# Patient Record
Sex: Female | Born: 1960 | Race: White | Hispanic: No | Marital: Married | Smoking: Never smoker
Health system: Southern US, Community
[De-identification: ages and names within clinical notes are randomized; demographics above are authoritative.]

## PROBLEM LIST (undated history)

## (undated) DIAGNOSIS — D649 Anemia, unspecified: Secondary | ICD-10-CM

## (undated) DIAGNOSIS — E782 Mixed hyperlipidemia: Secondary | ICD-10-CM

## (undated) DIAGNOSIS — M1990 Unspecified osteoarthritis, unspecified site: Secondary | ICD-10-CM

## (undated) DIAGNOSIS — L439 Lichen planus, unspecified: Secondary | ICD-10-CM

## (undated) DIAGNOSIS — R42 Dizziness and giddiness: Secondary | ICD-10-CM

## (undated) DIAGNOSIS — E785 Hyperlipidemia, unspecified: Secondary | ICD-10-CM

## (undated) DIAGNOSIS — F419 Anxiety disorder, unspecified: Secondary | ICD-10-CM

## (undated) DIAGNOSIS — M545 Low back pain, unspecified: Secondary | ICD-10-CM

## (undated) DIAGNOSIS — E669 Obesity, unspecified: Secondary | ICD-10-CM

## (undated) DIAGNOSIS — K219 Gastro-esophageal reflux disease without esophagitis: Secondary | ICD-10-CM

## (undated) DIAGNOSIS — I519 Heart disease, unspecified: Secondary | ICD-10-CM

## (undated) DIAGNOSIS — I10 Essential (primary) hypertension: Secondary | ICD-10-CM

## (undated) DIAGNOSIS — R002 Palpitations: Secondary | ICD-10-CM

## (undated) DIAGNOSIS — R079 Chest pain, unspecified: Secondary | ICD-10-CM

## (undated) HISTORY — PX: WISDOM TOOTH EXTRACTION: SHX21

## (undated) HISTORY — DX: Essential (primary) hypertension: I10

## (undated) HISTORY — PX: ABDOMINAL HYSTERECTOMY: SHX81

## (undated) HISTORY — DX: Low back pain, unspecified: M54.50

## (undated) HISTORY — DX: Anemia, unspecified: D64.9

## (undated) HISTORY — DX: Anxiety disorder, unspecified: F41.9

## (undated) HISTORY — DX: Mixed hyperlipidemia: E78.2

---
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
## 2005-12-20 ENCOUNTER — Ambulatory Visit (HOSPITAL_COMMUNITY): Admission: RE | Admit: 2005-12-20 | Discharge: 2005-12-20 | Payer: Self-pay | Admitting: Pulmonary Disease

## 2008-01-22 ENCOUNTER — Encounter: Admission: RE | Admit: 2008-01-22 | Discharge: 2008-01-22 | Payer: Self-pay | Admitting: Pulmonary Disease

## 2008-04-13 ENCOUNTER — Emergency Department (HOSPITAL_COMMUNITY): Admission: EM | Admit: 2008-04-13 | Discharge: 2008-04-13 | Payer: Self-pay | Admitting: Emergency Medicine

## 2008-04-22 ENCOUNTER — Ambulatory Visit (HOSPITAL_COMMUNITY): Admission: RE | Admit: 2008-04-22 | Discharge: 2008-04-22 | Payer: Self-pay | Admitting: Pulmonary Disease

## 2009-02-17 ENCOUNTER — Encounter: Admission: RE | Admit: 2009-02-17 | Discharge: 2009-02-17 | Payer: Self-pay | Admitting: Pulmonary Disease

## 2010-02-23 ENCOUNTER — Encounter
Admission: RE | Admit: 2010-02-23 | Discharge: 2010-02-23 | Payer: Self-pay | Source: Home / Self Care | Attending: Pulmonary Disease | Admitting: Pulmonary Disease

## 2010-07-04 ENCOUNTER — Emergency Department (HOSPITAL_COMMUNITY)
Admission: EM | Admit: 2010-07-04 | Discharge: 2010-07-04 | Disposition: A | Discharge status: Home / Self Care | Payer: Managed Care, Other (non HMO) | Attending: Emergency Medicine | Admitting: Emergency Medicine

## 2010-07-04 DIAGNOSIS — S0010XA Contusion of unspecified eyelid and periocular area, initial encounter: Secondary | ICD-10-CM | POA: Insufficient documentation

## 2010-07-04 DIAGNOSIS — X58XXXA Exposure to other specified factors, initial encounter: Secondary | ICD-10-CM | POA: Insufficient documentation

## 2011-02-16 ENCOUNTER — Other Ambulatory Visit: Payer: Self-pay | Admitting: Pulmonary Disease

## 2011-02-16 DIAGNOSIS — Z1231 Encounter for screening mammogram for malignant neoplasm of breast: Secondary | ICD-10-CM

## 2011-03-08 ENCOUNTER — Ambulatory Visit
Admission: RE | Admit: 2011-03-08 | Discharge: 2011-03-08 | Disposition: A | Discharge status: Home / Self Care | Payer: Managed Care, Other (non HMO) | Source: Ambulatory Visit | Attending: Pulmonary Disease | Admitting: Pulmonary Disease

## 2011-03-08 DIAGNOSIS — Z1231 Encounter for screening mammogram for malignant neoplasm of breast: Secondary | ICD-10-CM

## 2011-11-18 ENCOUNTER — Telehealth: Payer: Self-pay

## 2011-11-18 NOTE — Telephone Encounter (Signed)
LMOM to call.

## 2011-12-06 NOTE — Telephone Encounter (Signed)
LMOM to call.

## 2011-12-07 NOTE — Telephone Encounter (Signed)
Letter to pt and PCP.  

## 2012-06-23 ENCOUNTER — Telehealth: Payer: Self-pay

## 2012-06-23 NOTE — Telephone Encounter (Signed)
LMOM to call.

## 2012-06-27 NOTE — Telephone Encounter (Signed)
LMOM to call.

## 2012-06-29 ENCOUNTER — Other Ambulatory Visit: Payer: Self-pay

## 2012-06-29 ENCOUNTER — Telehealth: Payer: Self-pay

## 2012-06-29 DIAGNOSIS — Z1211 Encounter for screening for malignant neoplasm of colon: Secondary | ICD-10-CM

## 2012-06-29 NOTE — Telephone Encounter (Signed)
Gastroenterology Pre-Procedure Form    Request Date: 06/29/2012    Requesting Physician: Dr. Juanetta Gosling     PATIENT INFORMATION:  [name] is a 52 y.o., female (DOB=Sep 27, 1960).  PROCEDURE: Procedure(s) requested: colonoscopy Procedure Reason: screening for colon cancer  PATIENT REVIEW QUESTIONS: The patient reports the following:   1. Diabetes Melitis: no 2. Joint replacements in the past 12 months: no 3. Major health problems in the past 3 months: no 4. Has an artificial valve or MVP:no 5. Has been advised in past to take antibiotics in advance of a procedure like teeth cleaning: no}    MEDICATIONS & ALLERGIES:    Patient reports the following regarding taking any blood thinners:   Plavix? no Aspirin?no Coumadin?  no  Patient confirms/reports the following medications:  Current Outpatient Prescriptions  Medication Sig Dispense Refill  . Biotin 1000 MCG tablet Take 1,000 mcg by mouth 3 (three) times daily.      . ferrous sulfate 325 (65 FE) MG tablet Take 325 mg by mouth daily with breakfast.      . fish oil-omega-3 fatty acids 1000 MG capsule Take 1,200 mg by mouth daily.      . Multiple Vitamin (MULTIVITAMIN) capsule Take 1 capsule by mouth daily.      . NON FORMULARY Vitamin D3  2000 IU      One tablet daily      . NON FORMULARY Super B Complex    One tablet daily      . vitamin E 400 UNIT capsule Take 400 Units by mouth daily.       No current facility-administered medications for this visit.    Patient confirms/reports the following allergies:  No Known Allergies  Patient is appropriate to schedule for requested procedure(s): yes  AUTHORIZATION INFORMATION Primary Insurance:   ID #:   Group #:  Pre-Cert / Auth required:  Pre-Cert / Auth #:   Secondary Insurance:   ID #:   Group #:  Pre-Cert / Auth required:  Pre-Cert / Auth #:   No orders of the defined types were placed in this encounter.    SCHEDULE INFORMATION: Procedure has been scheduled as follows:   Date: 07/13/2012    Time: 2:30 PM  Location: [facility] Short Stay  This Gastroenterology Pre-Precedure Form is being routed to the following provider(s) for review: R. Roetta Sessions, MD

## 2012-06-29 NOTE — Telephone Encounter (Signed)
OK to schedule.  Hold iron for seven days.

## 2012-06-30 MED ORDER — PEG-KCL-NACL-NASULF-NA ASC-C 100 G PO SOLR: 1.0000 | ORAL | Status: DC

## 2012-06-30 NOTE — Telephone Encounter (Signed)
See separate triage.  

## 2012-06-30 NOTE — Telephone Encounter (Signed)
Rx sent to the pharmacy and instructions mailed to pt.  

## 2012-07-04 ENCOUNTER — Encounter (HOSPITAL_COMMUNITY): Payer: Self-pay | Admitting: Pharmacy Technician

## 2012-07-13 ENCOUNTER — Encounter (HOSPITAL_COMMUNITY)
Admission: RE | Disposition: A | Discharge status: Home / Self Care | Payer: Self-pay | Source: Ambulatory Visit | Attending: Internal Medicine

## 2012-07-13 ENCOUNTER — Ambulatory Visit (HOSPITAL_COMMUNITY)
Admission: RE | Admit: 2012-07-13 | Discharge: 2012-07-13 | Disposition: A | Discharge status: Home / Self Care | Payer: Managed Care, Other (non HMO) | Source: Ambulatory Visit | Attending: Internal Medicine | Admitting: Internal Medicine

## 2012-07-13 ENCOUNTER — Encounter (HOSPITAL_COMMUNITY): Payer: Self-pay | Admitting: *Deleted

## 2012-07-13 DIAGNOSIS — Z1211 Encounter for screening for malignant neoplasm of colon: Secondary | ICD-10-CM

## 2012-07-13 HISTORY — PX: COLONOSCOPY: SHX5424

## 2012-07-13 LAB — HM COLONOSCOPY

## 2012-07-13 SURGERY — COLONOSCOPY
Anesthesia: Moderate Sedation

## 2012-07-13 MED ORDER — ONDANSETRON HCL 4 MG/2ML IJ SOLN
INTRAMUSCULAR | Status: DC | PRN
  Administered 2012-07-13: 4 mg via INTRAVENOUS

## 2012-07-13 MED ORDER — MEPERIDINE HCL 100 MG/ML IJ SOLN
INTRAMUSCULAR | Status: DC | PRN
  Administered 2012-07-13: 50 mg via INTRAVENOUS
  Administered 2012-07-13: 25 mg via INTRAVENOUS

## 2012-07-13 MED ORDER — MEPERIDINE HCL 100 MG/ML IJ SOLN
INTRAMUSCULAR | Status: AC
  Filled 2012-07-13: qty 2

## 2012-07-13 MED ORDER — ONDANSETRON HCL 4 MG/2ML IJ SOLN
INTRAMUSCULAR | Status: AC
  Filled 2012-07-13: qty 2

## 2012-07-13 MED ORDER — MIDAZOLAM HCL 5 MG/5ML IJ SOLN
INTRAMUSCULAR | Status: DC | PRN
  Administered 2012-07-13: 1 mg via INTRAVENOUS
  Administered 2012-07-13 (×2): 2 mg via INTRAVENOUS

## 2012-07-13 MED ORDER — STERILE WATER FOR IRRIGATION IR SOLN
Status: DC | PRN
  Administered 2012-07-13: 14:00:00

## 2012-07-13 MED ORDER — SODIUM CHLORIDE 0.9 % IV SOLN
INTRAVENOUS | Status: DC
  Administered 2012-07-13: 13:00:00 via INTRAVENOUS

## 2012-07-13 MED ORDER — MIDAZOLAM HCL 5 MG/5ML IJ SOLN
INTRAMUSCULAR | Status: AC
  Filled 2012-07-13: qty 10

## 2012-07-13 NOTE — Op Note (Signed)
[facility] [address] [place], [address]   COLONOSCOPY PROCEDURE REPORT  PATIENT: [name], [name]  MR#:         [id number] BIRTHDATE: 02-03-1960 , 52  yrs. old GENDER: Female ENDOSCOPIST: R.  Roetta Sessions, MD FACP [facility] REFERRED BY:  Kari Baars, M.D. PROCEDURE DATE:  07/13/2012 PROCEDURE:     Screening Ileocolonoscopy  INDICATIONS: first ever average risk colorectal cancer screening examination  INFORMED CONSENT:  The risks, benefits, alternatives and imponderables including but not limited to bleeding, perforation as well as the possibility of a missed lesion have been reviewed.  The potential for biopsy, lesion removal, etc. have also been discussed.  Questions have been answered.  All parties agreeable. Please see the history and physical in the medical record for more information.  MEDICATIONS: Versed 5 mg IV and Demerol 75 mg IV in divided doses. Cetacaine spray. Zofran 4 mg IV  DESCRIPTION OF PROCEDURE:  After a digital rectal exam was performed, the EC-[id number] ([id number])  colonoscope was advanced from the anus through the rectum and colon to the area of the cecum, ileocecal valve and appendiceal orifice.  The cecum was deeply intubated.  These structures were well-seen and photographed for the record.  From the level of the cecum and ileocecal valve, the scope was slowly and cautiously withdrawn.  The mucosal surfaces were carefully surveyed utilizing scope tip deflection to facilitate fold flattening as needed.  The scope was pulled down into the rectum where a thorough examination including retroflexion was performed.    FINDINGS:  Adequate preparation. Normal-appearing rectal, colonic and distal 10 cm of terminal ileal mucosa.  THERAPEUTIC / DIAGNOSTIC MANEUVERS PERFORMED:  none  COMPLICATIONS: none  CECAL WITHDRAWAL TIME:  9 minutes  IMPRESSION:  Normal colonoscopy  RECOMMENDATIONS: Repeat screening colonoscopy in 10  years.   _______________________________ eSigned:  R. Roetta Sessions, MD FACP [facility] 07/13/2012 2:40 PM   CC:

## 2012-07-13 NOTE — H&P (Signed)
Primary Care Physician:  Fredirick Maudlin, MD Primary Gastroenterologist:  Dr. Jena Gauss  Pre-Procedure History & Physical: HPI:  [name] is a 52 y.o. female is here for a screening colonoscopy.   No bowel symptoms. No family history of colon cancer or colon polyps. No prior colonoscopy.   Past Medical History  Diagnosis Date  . Medical history non-contributory     Past Surgical History  Procedure Laterality Date  . Abdominal hysterectomy  1991-1992    [facility]    Prior to Admission medications   Medication Sig Start Date End Date Taking? Authorizing Provider  Biotin 1000 MCG tablet Take 1,000 mcg by mouth daily.    Yes Historical Provider, MD  ferrous sulfate 325 (65 FE) MG tablet Take 325 mg by mouth daily with breakfast.   Yes Historical Provider, MD  fish oil-omega-3 fatty acids 1000 MG capsule Take 1,200 mg by mouth daily.   Yes Historical Provider, MD  Multiple Vitamin (MULTIVITAMIN) capsule Take 1 capsule by mouth daily.   Yes Historical Provider, MD  NON FORMULARY Vitamin D3  2000 IU      One tablet daily   Yes Historical Provider, MD  NON FORMULARY Super B Complex    One tablet daily   Yes Historical Provider, MD  vitamin E 400 UNIT capsule Take 400 Units by mouth daily.   Yes Historical Provider, MD    Allergies as of 06/29/2012  . (No Known Allergies)    Family History  Problem Relation Age of Onset  . Colon cancer Neg Hx   . Prostate cancer Father     History   Social History  . Marital Status: Married    Spouse Name: N/A    Number of Children: N/A  . Years of Education: N/A   Occupational History  . Not on file.   Social History Main Topics  . Smoking status: Never Smoker   . Smokeless tobacco: Not on file  . [place] Use: [place]     [place]   . Drug Use: No  . Sexually Active: Yes    Birth Control/ Protection: Surgical   Other Topics Concern  . Not on file   Social History Narrative  . No narrative on file     Review of Systems: See HPI, otherwise negative ROS  Physical Exam: BP 137/78  Pulse 74  Temp(Src) 97.4 F (36.3 C) (Oral)  Resp 21  Ht 5\' 4"  (1.626 m)  Wt 177 lb (80.287 kg)  BMI 30.37 kg/m2  SpO2 99% General:   Alert,  Well-developed, well-nourished, pleasant and cooperative in NAD Head:  Normocephalic and atraumatic. Eyes:  Sclera clear, no icterus.   Conjunctiva pink. Ears:  Normal auditory acuity. Nose:  No deformity, discharge,  or lesions. Mouth:  No deformity or lesions, dentition normal. Neck:  Supple; no masses or thyromegaly. Lungs:  Clear throughout to auscultation.   No wheezes, crackles, or rhonchi. No acute distress. Heart:  Regular rate and rhythm; no murmurs, clicks, rubs,  or gallops. Abdomen:  Soft, nontender and nondistended. No masses, hepatosplenomegaly or hernias noted. Normal bowel sounds, without guarding, and without rebound.   Msk:  Symmetrical without gross deformities. Normal posture. Pulses:  Normal pulses noted. Extremities:  Without clubbing or edema. Neurologic:  Alert and  oriented x4;  grossly normal neurologically. Skin:  Intact without significant lesions or rashes. Cervical Nodes:  No significant cervical adenopathy. Psych:  Alert and cooperative. Normal mood and affect.  Impression/Plan: [name] is now here to undergo a screening colonoscopy.  First ever average risk screening examination.  Risks, benefits, limitations, imponderables and alternatives regarding colonoscopy have been reviewed with the patient. Questions have been answered. All parties agreeable.

## 2012-07-17 ENCOUNTER — Encounter (HOSPITAL_COMMUNITY): Payer: Self-pay | Admitting: Internal Medicine

## 2012-10-19 ENCOUNTER — Other Ambulatory Visit: Payer: Self-pay | Admitting: Pulmonary Disease

## 2012-10-19 DIAGNOSIS — Z1231 Encounter for screening mammogram for malignant neoplasm of breast: Secondary | ICD-10-CM

## 2013-04-05 ENCOUNTER — Ambulatory Visit: Payer: Managed Care, Other (non HMO)

## 2013-05-10 ENCOUNTER — Ambulatory Visit: Payer: Managed Care, Other (non HMO)

## 2013-06-07 ENCOUNTER — Encounter (INDEPENDENT_AMBULATORY_CARE_PROVIDER_SITE_OTHER): Payer: Self-pay

## 2013-06-07 ENCOUNTER — Ambulatory Visit
Admission: RE | Admit: 2013-06-07 | Discharge: 2013-06-07 | Disposition: A | Discharge status: Home / Self Care | Payer: Managed Care, Other (non HMO) | Source: Ambulatory Visit | Attending: Pulmonary Disease | Admitting: Pulmonary Disease

## 2013-06-07 DIAGNOSIS — Z1231 Encounter for screening mammogram for malignant neoplasm of breast: Secondary | ICD-10-CM

## 2014-10-01 ENCOUNTER — Other Ambulatory Visit: Payer: Self-pay

## 2014-10-01 DIAGNOSIS — Z1231 Encounter for screening mammogram for malignant neoplasm of breast: Secondary | ICD-10-CM

## 2014-10-17 ENCOUNTER — Ambulatory Visit
Admission: RE | Admit: 2014-10-17 | Discharge: 2014-10-17 | Disposition: A | Discharge status: Home / Self Care | Payer: Managed Care, Other (non HMO) | Source: Ambulatory Visit

## 2014-10-17 DIAGNOSIS — Z1231 Encounter for screening mammogram for malignant neoplasm of breast: Secondary | ICD-10-CM

## 2016-04-28 ENCOUNTER — Encounter (HOSPITAL_COMMUNITY): Payer: Self-pay | Admitting: *Deleted

## 2016-04-28 ENCOUNTER — Emergency Department (HOSPITAL_COMMUNITY)
Admission: EM | Admit: 2016-04-28 | Discharge: 2016-04-28 | Disposition: A | Discharge status: Home / Self Care | Payer: Managed Care, Other (non HMO) | Attending: Emergency Medicine | Admitting: Emergency Medicine

## 2016-04-28 ENCOUNTER — Emergency Department (HOSPITAL_COMMUNITY): Payer: Managed Care, Other (non HMO)

## 2016-04-28 DIAGNOSIS — Z79899 Other long term (current) drug therapy: Secondary | ICD-10-CM | POA: Insufficient documentation

## 2016-04-28 DIAGNOSIS — R0789 Other chest pain: Secondary | ICD-10-CM | POA: Diagnosis present

## 2016-04-28 DIAGNOSIS — R079 Chest pain, unspecified: Secondary | ICD-10-CM

## 2016-04-28 LAB — CBC
HCT: 40.2 % (ref 36.0–46.0)
Hemoglobin: 13.2 g/dL (ref 12.0–15.0)
MCH: 30.3 pg (ref 26.0–34.0)
MCHC: 32.8 g/dL (ref 30.0–36.0)
MCV: 92.4 fL (ref 78.0–100.0)
Platelets: 318 10*3/uL (ref 150–400)
RBC: 4.35 MIL/uL (ref 3.87–5.11)
RDW: 12.8 % (ref 11.5–15.5)
WBC: 5.9 10*3/uL (ref 4.0–10.5)

## 2016-04-28 LAB — D-DIMER, QUANTITATIVE: D-Dimer, Quant: 0.48 ug/mL-FEU (ref 0.00–0.50)

## 2016-04-28 LAB — BASIC METABOLIC PANEL
Anion gap: 7 (ref 5–15)
BUN: 12 mg/dL (ref 6–20)
CO2: 28 mmol/L (ref 22–32)
Calcium: 9.3 mg/dL (ref 8.9–10.3)
Chloride: 104 mmol/L (ref 101–111)
Creatinine, Ser: 0.78 mg/dL (ref 0.44–1.00)
GFR calc Af Amer: >60 mL/min (ref >60)
GFR calc non Af Amer: >60 mL/min (ref >60)
Glucose, Bld: 114 mg/dL — ABNORMAL HIGH (ref 65–99)
Potassium: 3.5 mmol/L (ref 3.5–5.1)
Sodium: 139 mmol/L (ref 135–145)

## 2016-04-28 LAB — TROPONIN I
Troponin I: <0.03 ng/mL (ref <0.03)
Troponin I: <0.03 ng/mL (ref <0.03)

## 2016-04-28 MED ORDER — FAMOTIDINE 20 MG PO TABS: 20.0000 mg | ORAL_TABLET | Freq: Two times a day (BID) | ORAL | 0 refills | Status: DC

## 2016-04-28 MED ORDER — GI COCKTAIL ~~LOC~~
30.0000 mL | Freq: Once | ORAL | Status: AC
  Administered 2016-04-28: 30 mL via ORAL
  Filled 2016-04-28: qty 30

## 2016-04-28 NOTE — ED Triage Notes (Signed)
Pt comes in for central chest pain that started last night. Pt took one asa and it didn't yield any relief. Pt describes this pain as a pressure. Denies any cardiac history.

## 2016-04-28 NOTE — ED Notes (Signed)
Patient transported to X-ray 

## 2016-04-28 NOTE — ED Provider Notes (Signed)
[facility]-EMERGENCY DEPT Provider Note   CSN: [id number] Arrival date & time: 04/28/16  1232     History   Chief Complaint Chief Complaint  Patient presents with  . Chest Pain    HPI [name] is a 56 y.o. female.  HPI  Pt was seen at 1345. Per pt, c/o gradual onset and persistence of constant chest "pain" that began approximately 0130 over night last night. Describes the CP as located in her central chest and "pressure." States the CP woke her up from sleep. Pt took ASA without improvement. Denies any hx of similar symptoms. Denies SOB/cough, no palpitations, no abd pain, no N/V/D, no back pain, no fevers, no rash.   Past Medical History:  Diagnosis Date  . Medical history non-contributory     There are no active problems to display for this patient.   Past Surgical History:  Procedure Laterality Date  . ABDOMINAL HYSTERECTOMY  1991-1992   [facility]  . COLONOSCOPY N/A 07/13/2012   Procedure: COLONOSCOPY;  Surgeon: Corbin Ade, MD;  Location: [facility] ENDO SUITE;  Service: Endoscopy;  Laterality: N/A;  2:30 PM-moved to [address] to notify pt    OB History    No data available       Home Medications    Prior to Admission medications   Medication Sig Start Date End Date Taking? Authorizing Provider  Biotin 1000 MCG tablet Take 1,000 mcg by mouth daily.     Historical Provider, MD  ferrous sulfate 325 (65 FE) MG tablet Take 325 mg by mouth daily with breakfast.    Historical Provider, MD  fish oil-omega-3 fatty acids 1000 MG capsule Take 1,200 mg by mouth daily.    Historical Provider, MD  Multiple Vitamin (MULTIVITAMIN) capsule Take 1 capsule by mouth daily.    Historical Provider, MD  NON FORMULARY Vitamin D3  2000 IU      One tablet daily    Historical Provider, MD  NON FORMULARY Super B Complex    One tablet daily    Historical Provider, MD  vitamin E 400 UNIT capsule Take 400 Units by mouth daily.    Historical Provider, MD    Family History Family History  Problem  Relation Age of Onset  . Prostate cancer Father   . Colon cancer Neg Hx     Social History Social History  Substance Use Topics  . Smoking status: Never Smoker  . Smokeless tobacco: Never Used  . Alcohol use Yes     [place]      Allergies   Patient has no known allergies.   Review of Systems Review of Systems ROS: Statement: All systems negative except as marked or noted in the HPI; Constitutional: Negative for fever and chills. ; ; Eyes: Negative for eye pain, redness and discharge. ; ; ENMT: Negative for ear pain, hoarseness, nasal congestion, sinus pressure and sore throat. ; ; Cardiovascular: +CP. Negative for palpitations, diaphoresis, dyspnea and peripheral edema. ; ; Respiratory: Negative for cough, wheezing and stridor. ; ; Gastrointestinal: Negative for nausea, vomiting, diarrhea, abdominal pain, blood in stool, hematemesis, jaundice and rectal bleeding. . ; ; Genitourinary: Negative for dysuria, flank pain and hematuria. ; ; Musculoskeletal: Negative for back pain and neck pain. Negative for swelling and trauma.; ; Skin: Negative for pruritus, rash, abrasions, blisters, bruising and skin lesion.; ; Neuro: Negative for headache, lightheadedness and neck stiffness. Negative for weakness, altered level of consciousness, altered mental status, extremity weakness,  paresthesias, involuntary movement, seizure and syncope.       Physical Exam Updated Vital Signs BP 135/84 (BP Location: Left Arm)   Pulse 83   Temp 97.9 F (36.6 C) (Oral)   Resp 18   Ht  (1.626 m)   Wt 205 lb (93 kg)   SpO2 99%   BMI 35.19 kg/m   Physical Exam 1350: Physical examination:  Nursing notes reviewed; Vital signs and O2 SAT reviewed;  Constitutional: Well developed, Well nourished, Well hydrated, In no acute distress; Head:  Normocephalic, atraumatic; Eyes: EOMI, PERRL, No scleral icterus; ENMT: Mouth and pharynx normal, Mucous membranes moist; Neck: Supple, Full  range of motion, No lymphadenopathy; Cardiovascular: Regular rate and rhythm, No gallop; Respiratory: Breath sounds clear & equal bilaterally, No wheezes.  Speaking full sentences with ease, Normal respiratory effort/excursion; Chest: Nontender, Movement normal; Abdomen: Soft, Nontender, Nondistended, Normal bowel sounds; Genitourinary: No CVA tenderness; Extremities: Pulses normal, No tenderness, No edema, No calf edema or asymmetry.; Neuro: AA&Ox3, Major CN grossly intact.  Speech clear. No gross focal motor or sensory deficits in extremities.; Skin: Color normal, Warm, Dry.   ED Treatments / Results  Labs (all labs ordered are listed, but only abnormal results are displayed)   EKG  EKG Interpretation  Date/Time:  Wednesday April 28 2016 12:41:09 EDT Ventricular Rate:  82 PR Interval:    QRS Duration: 95 QT Interval:  382 QTC Calculation: 447 R Axis:   63 Text Interpretation:  Sinus rhythm Atrial premature complex Borderline T abnormalities, diffuse leads Baseline wander Artifact No old tracing to compare Confirmed by [facility]  MD, Nicholos Johns [phone]) on 04/28/2016 2:05:14 PM        EKG Interpretation  Date/Time:  Wednesday April 28 2016 15:47:18 EDT Ventricular Rate:  60 PR Interval:    QRS Duration: 97 QT Interval:  421 QTC Calculation: 421 R Axis:   58 Text Interpretation:  Sinus rhythm Since last tracing of earlier today No significant change was found Confirmed by [facility]  MD, Nicholos Johns [phone]) on 04/28/2016 4:13:22 PM         Radiology   Procedures Procedures (including critical care time)  Medications Ordered in ED Medications  gi cocktail (Maalox,Lidocaine,Donnatal) (not administered)     Initial Impression / Assessment and Plan / ED Course  I have reviewed the triage vital signs and the nursing notes.  Pertinent labs & imaging results that were available during my care of the patient were reviewed by me and considered in my medical decision making (see chart  for details).  MDM Reviewed: previous chart, nursing note and vitals Reviewed previous: labs Interpretation: labs, ECG and x-ray   Results for orders placed or performed during the hospital encounter of 04/28/16  Basic metabolic panel  Result Value Ref Range   Sodium 139 135 - 145 mmol/L   Potassium 3.5 3.5 - 5.1 mmol/L   Chloride 104 101 - 111 mmol/L   CO2 28 22 - 32 mmol/L   Glucose, Bld 114 (H) 65 - 99 mg/dL   BUN 12 6 - 20 mg/dL   Creatinine, Ser 0.98 0.44 - 1.00 mg/dL   Calcium 9.3 8.9 - 11.9 mg/dL   GFR calc non Af Amer >60 >60 mL/min   GFR calc Af Amer >60 >60 mL/min   Anion gap 7 5 - 15  CBC  Result Value Ref Range   WBC 5.9 4.0 - 10.5 K/uL   RBC 4.35 3.87 - 5.11 MIL/uL   Hemoglobin 13.2 12.0 -  15.0 g/dL   HCT 09.8 11.9 - 14.7 %   MCV 92.4 78.0 - 100.0 fL   MCH 30.3 26.0 - 34.0 pg   MCHC 32.8 30.0 - 36.0 g/dL   RDW 82.9 56.2 - 13.0 %   Platelets 318 150 - 400 K/uL  Troponin I  Result Value Ref Range   Troponin I <0.03 <0.03 ng/mL  D-dimer, quantitative  Result Value Ref Range   D-Dimer, Quant 0.48 0.00 - 0.50 ug/mL-FEU  Troponin I  Result Value Ref Range   Troponin I <0.03 <0.03 ng/mL   Dg Chest 2 View Result Date: 04/28/2016 CLINICAL DATA:  Chest pain. EXAM: CHEST  2 VIEW COMPARISON:  Radiographs of March 29th 2010. FINDINGS: The heart size and mediastinal contours are within normal limits. Both lungs are clear. No pneumothorax or pleural effusion is noted. The visualized skeletal structures are unremarkable. IMPRESSION: No active cardiopulmonary disease. Electronically Signed   By: Lupita Raider, M.D.   On: 04/28/2016 13:07    1655:  Doubt PE as cause for symptoms with normal d-dimer and low risk [place].  Doubt ACS as cause for symptoms with normal troponin x2 and unchanged EKG from previous after 12+ hours of constant symptoms, HEART score 1 (age).  States GI cocktail did improve her symptoms somewhat. Tx symptomatically, f/u PMD, GI MD, Cards MD. Dx and  testing d/w pt and family.  Questions answered.  Verb understanding, agreeable to d/c home with outpt f/u.    Final Clinical Impressions(s) / ED Diagnoses   Final diagnoses:  None    New Prescriptions New Prescriptions   No medications on file     Samuel Jester, DO 05/01/16 2345

## 2016-04-28 NOTE — Discharge Instructions (Signed)
Eat a bland diet, avoiding greasy, fatty, fried foods, as well as spicy and acidic foods or beverages.  Avoid eating within the hour or 2 before going to bed or laying down.  Also avoid teas, colas, coffee, chocolate, pepermint and spearment.  May also take over the counter maalox/mylanta, as directed on packaging, as needed for discomfort.  Take the prescription as directed.  Call your regular medical doctor and the Cardiologist tomorrow to schedule a follow up appointment this week.  Call your GI doctor tomorrow to schedule a follow up appointment within the next week. Return to the Emergency Department immediately if worsening.

## 2016-06-01 ENCOUNTER — Encounter: Payer: Self-pay | Admitting: Cardiology

## 2016-06-01 ENCOUNTER — Ambulatory Visit (INDEPENDENT_AMBULATORY_CARE_PROVIDER_SITE_OTHER): Payer: Managed Care, Other (non HMO) | Admitting: Cardiology

## 2016-06-01 VITALS — BP 116/70 | HR 83 | Ht 64.0 in | Wt 205.0 lb

## 2016-06-01 DIAGNOSIS — R011 Cardiac murmur, unspecified: Secondary | ICD-10-CM | POA: Diagnosis not present

## 2016-06-01 DIAGNOSIS — R0789 Other chest pain: Secondary | ICD-10-CM

## 2016-06-01 DIAGNOSIS — I10 Essential (primary) hypertension: Secondary | ICD-10-CM

## 2016-06-01 NOTE — Progress Notes (Signed)
Clinical Summary [name] is a 56 y.o.female seen today as a new patient, referred by Dr Juanetta GoslingHawkins for chest pain.  1. Chest pain - chest pain started around 130AM while asleep. Crushing like pain midchest, 10/10 in severity. No other associated symptoms.  - not positional, not worst with palpation - went back to sleep, still had some pressure in midchest.  - constant pressure, better with GI cocktail. Returned after hours.   - seen in ER 04/2016 with chest pain. Trop neg x2. CXR no acute process. EKG SR with PACs, no ischemic changes - symptoms better with GI cocktail - started on pepcid. Since that time no recurrent peisodes.  - walks regularly at work, tolerating exertion without troubles  CAD: HTN, father stents x 5 in late 60s     2. HTN - started on Toprol recently    Past Medical History:  Diagnosis Date  . Medical history non-contributory      No Known Allergies   Current Outpatient Prescriptions  Medication Sig Dispense Refill  . Biotin 1000 MCG tablet Take 1,000 mcg by mouth daily.     . famotidine (PEPCID) 20 MG tablet Take 1 tablet (20 mg total) by mouth 2 (two) times daily. 30 tablet 0  . ferrous sulfate 325 (65 FE) MG tablet Take 325 mg by mouth daily with breakfast.    . fish oil-omega-3 fatty acids 1000 MG capsule Take 1,200 mg by mouth daily.    . Glucosamine-Chondroitin (GLUCOSAMINE CHONDR COMPLEX PO) Take 500 mg by mouth daily.    . Multiple Vitamin (MULTIVITAMIN WITH MINERALS) TABS tablet Take 1 tablet by mouth daily.    . Multiple Vitamin (MULTIVITAMIN) capsule Take 1 capsule by mouth daily.    . Turmeric 450 MG CAPS Take 1 capsule by mouth daily.     No current facility-administered medications for this visit.      Past Surgical History:  Procedure Laterality Date  . ABDOMINAL HYSTERECTOMY  1991-1992   [facility]  . COLONOSCOPY N/A 07/13/2012   Procedure: COLONOSCOPY;  Surgeon: Corbin Adeobert M Rourk, MD;  Location: [facility] ENDO SU[facility];  Service: Endoscopy;   Laterality: N/A;  2:30 PM-moved to [address] to notify pt     No Known Allergies    Family History  Problem Relation Age of Onset  . Prostate cancer Father   . Colon cancer Neg Hx      Social History Ms. Fontanella reports that she has never smoked. She has never used smokeless tobacco. [name] reports that she drinks alcohol.   Review of Systems CONSTITUTIONAL: No weight loss, fever, chills, weakness or fatigue.  HEENT: Eyes: No visual loss, blurred vision, double vision or yellow sclerae.No hearing loss, sneezing, congestion, runny nose or sore throat.  SKIN: No rash or itching.  CARDIOVASCULAR: per hpi RESPIRATORY: No shortness of breath, cough or sputum.  GASTROINTESTINAL: No anorexia, nausea, vomiting or diarrhea. No abdominal pain or blood.  GENITOURINARY: No burning on urination, no polyuria NEUROLOGICAL: No headache, dizziness, syncope, paralysis, ataxia, numbness or tingling in the extremities. No change in bowel or bladder control.  MUSCULOSKELETAL: No muscle, back pain, joint pain or stiffness.  LYMPHATICS: No enlarged nodes. No history of splenectomy.  PSYCHIATRIC: No history of depression or anxiety.  ENDOCRINOLOGIC: No reports of sweating, cold or heat intolerance. No polyuria or polydipsia.  Marland Kitchen.   Physical Examination Vitals:   06/01/16 0831 06/01/16 0839  BP: 118/72 116/70  Pulse: 83    Vitals:  06/01/16 0831  Weight: 205 lb (93 kg)  Height: 5\' 4"  (1.626 m)    Gen: resting comfortably, no acute distress HEENT: no scleral icterus, pupils equal round and reactive, no palptable cervical adenopathy,  CV: RRR, 2/6 sysotlic murmur at apex, no jvd Resp: Clear to auscultation bilaterally GI: abdomen is soft, non-tender, non-distended, normal bowel sounds, no hepatosplenomegaly MSK: extremities are warm, no edema.  Skin: warm, no rash Neuro:  no focal deficits Psych: appropriate affect      Assessment and Plan   1. Chest pain - symptoms  consistent with GI etiology, no reccurce since starting antacid - continue to monitor, would not pursue ischemic testing at this time  2. HTN - at goal, continue current meds  3. Heart murmur - obtain echo   F/u 6 months   Antoine Poche, M.D

## 2016-06-01 NOTE — Patient Instructions (Signed)

## 2016-06-03 ENCOUNTER — Ambulatory Visit (HOSPITAL_COMMUNITY)
Admission: RE | Admit: 2016-06-03 | Discharge: 2016-06-03 | Disposition: A | Discharge status: Home / Self Care | Payer: Managed Care, Other (non HMO) | Source: Ambulatory Visit | Attending: Cardiology | Admitting: Cardiology

## 2016-06-03 DIAGNOSIS — I071 Rheumatic tricuspid insufficiency: Secondary | ICD-10-CM | POA: Insufficient documentation

## 2016-06-03 DIAGNOSIS — Z8249 Family history of ischemic heart disease and other diseases of the circulatory system: Secondary | ICD-10-CM | POA: Insufficient documentation

## 2016-06-03 DIAGNOSIS — R011 Cardiac murmur, unspecified: Secondary | ICD-10-CM

## 2016-06-03 DIAGNOSIS — I209 Angina pectoris, unspecified: Secondary | ICD-10-CM | POA: Diagnosis not present

## 2016-06-03 NOTE — Progress Notes (Signed)
*  PRELIMINARY RESULTS* Echocardiogram 2D Echocardiogram has been performed.  Jeryl Columbialliott, Deng Kemler 06/03/2016, 12:18 PM

## 2016-06-10 ENCOUNTER — Encounter: Payer: Managed Care, Other (non HMO) | Admitting: Diagnostic Neuroimaging

## 2016-06-17 ENCOUNTER — Encounter (INDEPENDENT_AMBULATORY_CARE_PROVIDER_SITE_OTHER): Payer: Self-pay

## 2016-06-17 ENCOUNTER — Ambulatory Visit (INDEPENDENT_AMBULATORY_CARE_PROVIDER_SITE_OTHER): Payer: Managed Care, Other (non HMO) | Admitting: Diagnostic Neuroimaging

## 2016-06-17 DIAGNOSIS — R202 Paresthesia of skin: Secondary | ICD-10-CM

## 2016-06-17 DIAGNOSIS — Z0289 Encounter for other administrative examinations: Secondary | ICD-10-CM

## 2016-06-17 DIAGNOSIS — R200 Anesthesia of skin: Secondary | ICD-10-CM

## 2016-06-17 NOTE — Procedures (Signed)
[facility]  NCS (NERVE CONDUCTION STUDY) WITH EMG (ELECTROMYOGRAPHY) REPORT   STUDY DATE: 06/17/16 PATIENT NAME: [name] DOB: 02/20/1960 MRN: [id number]  ORDERING CLINICIAN: Kari BaarsEdward Hawkins, MD  TECHNOLOGIST: Charlesetta IvoryBeau Handy  ELECTROMYOGRAPHER: Glenford BayleyVikram R. Tykira Wachs, MD  CLINICAL INFORMATION: 56 year old female with right hand numbness.   FINDINGS: NERVE CONDUCTION STUDY: Right median motor response has prolonged distal latency (8.4 ms) decrease tablet, normal conduction velocity.  Left median motor response has slightly prolonged distal latency (4.5 ms) normal amplitude, normal Velocity.  Right ulnar motor response is normal.  Right median sensory response could not be obtained. Left median sensory response has prolonged peak latency and decreased amplitude.  Right ulnar sensory response is normal.   NEEDLE ELECTROMYOGRAPHY: Needle examination of right upper tremor deltoid, biceps, triceps, flexor carpi radialis, first dorsal interosseous is normal.   IMPRESSION:  Abnormal study demonstrating: - Right greater than left median neuropathies at the wrist consistent with right greater than left carpal tunnel syndrome. This is moderate to severe on the right and mild on the left in severity.     INTERPRETING PHYSICIAN:  Suanne MarkerVIKRAM R. Londyn Wotton, MD Certified in Neurology, Neurophysiology and Neuroimaging  [facility] [address], [address] [place], [place] [address] [phone]    [facility]    Nerve / Sites Rec. Site Latency Ref. Amplitude Ref. Rel Amp Segments Distance Velocity Ref. Area    ms ms mV mV %  cm m/s m/s mVms  R Median - APB     Wrist APB 8.4 ?4.4 3.7 ?4.0 100 Wrist - APB 7   17.8     Upper arm APB 12.3  3.7  99.2 Upper arm - Wrist 23 59 ?49 18.6  L Median - APB     Wrist APB 4.5 ?4.4 4.7 ?4.0 100 Wrist - APB 7   18.6     Upper arm APB 8.5  4.8  103 Upper arm - Wrist 23 57 ?49 19.7  R Ulnar - ADM     Wrist ADM 2.6  ?3.3 12.3 ?6.0 100 Wrist - ADM 7   38.4     B.Elbow ADM 6.0  11.5  93.5 B.Elbow - Wrist 19 55 ?49 37.4     A.Elbow ADM 7.9  10.9  95.2 A.Elbow - B.Elbow 10 55 ?49 37.1         A.Elbow - Wrist               SNC    Nerve / Sites Rec. Site Peak Lat Ref.  Amp Ref. Segments Distance    ms ms V V  cm  R Median - Orthodromic (Dig II, Mid palm)     Dig II Wrist NR ?3.40 NR ?10 Dig II - Wrist 13  L Median - Orthodromic (Dig II, Mid palm)     Dig II Wrist 3.96 ?3.40 7 ?10 Dig II - Wrist 13  R Ulnar - Orthodromic, (Dig V, Mid palm)     Dig V Wrist 2.92 ?3.10 8 ?5 Dig V - Wrist 7911           F  Wave    Nerve F Lat Ref.   ms ms  R Ulnar - ADM 26.4 ?32.0       EMG full       EMG Summary Table    Spontaneous MUAP Recruitment  Muscle IA Fib PSW Fasc Other Amp Dur. Poly Pattern  R. Deltoid Normal None None None _______ Normal  Normal Normal Normal  R. Biceps brachii Normal None None None _______ Normal Normal Normal Normal  R. Triceps brachii Normal None None None _______ Normal Normal Normal Normal  R. Flexor carpi radialis Normal None None None _______ Normal Normal Normal Normal  R. First dorsal interosseous Normal None None None _______ Normal Normal Normal Normal

## 2016-08-26 LAB — TSH: TSH: 0.6 (ref 0.41–5.90)

## 2016-08-30 ENCOUNTER — Other Ambulatory Visit: Payer: Self-pay | Admitting: Orthopedic Surgery

## 2016-08-30 DIAGNOSIS — G5603 Carpal tunnel syndrome, bilateral upper limbs: Secondary | ICD-10-CM | POA: Insufficient documentation

## 2016-08-30 DIAGNOSIS — M65331 Trigger finger, right middle finger: Secondary | ICD-10-CM | POA: Insufficient documentation

## 2016-10-20 ENCOUNTER — Encounter (HOSPITAL_BASED_OUTPATIENT_CLINIC_OR_DEPARTMENT_OTHER): Payer: Self-pay | Admitting: *Deleted

## 2016-10-26 ENCOUNTER — Ambulatory Visit (HOSPITAL_BASED_OUTPATIENT_CLINIC_OR_DEPARTMENT_OTHER): Payer: Managed Care, Other (non HMO) | Admitting: Anesthesiology

## 2016-10-26 ENCOUNTER — Ambulatory Visit (HOSPITAL_BASED_OUTPATIENT_CLINIC_OR_DEPARTMENT_OTHER)
Admission: RE | Admit: 2016-10-26 | Discharge: 2016-10-26 | Disposition: A | Discharge status: Home / Self Care | Payer: Managed Care, Other (non HMO) | Source: Ambulatory Visit | Attending: Orthopedic Surgery | Admitting: Orthopedic Surgery

## 2016-10-26 ENCOUNTER — Encounter (HOSPITAL_BASED_OUTPATIENT_CLINIC_OR_DEPARTMENT_OTHER)
Admission: RE | Disposition: A | Discharge status: Home / Self Care | Payer: Self-pay | Source: Ambulatory Visit | Attending: Orthopedic Surgery

## 2016-10-26 ENCOUNTER — Encounter (HOSPITAL_BASED_OUTPATIENT_CLINIC_OR_DEPARTMENT_OTHER): Payer: Self-pay

## 2016-10-26 DIAGNOSIS — Z833 Family history of diabetes mellitus: Secondary | ICD-10-CM | POA: Diagnosis not present

## 2016-10-26 DIAGNOSIS — I10 Essential (primary) hypertension: Secondary | ICD-10-CM | POA: Diagnosis not present

## 2016-10-26 DIAGNOSIS — Z90710 Acquired absence of both cervix and uterus: Secondary | ICD-10-CM | POA: Diagnosis not present

## 2016-10-26 DIAGNOSIS — G5601 Carpal tunnel syndrome, right upper limb: Secondary | ICD-10-CM | POA: Insufficient documentation

## 2016-10-26 DIAGNOSIS — M65331 Trigger finger, right middle finger: Secondary | ICD-10-CM | POA: Insufficient documentation

## 2016-10-26 DIAGNOSIS — M1990 Unspecified osteoarthritis, unspecified site: Secondary | ICD-10-CM | POA: Insufficient documentation

## 2016-10-26 DIAGNOSIS — Z8042 Family history of malignant neoplasm of prostate: Secondary | ICD-10-CM | POA: Diagnosis not present

## 2016-10-26 DIAGNOSIS — Z8249 Family history of ischemic heart disease and other diseases of the circulatory system: Secondary | ICD-10-CM | POA: Insufficient documentation

## 2016-10-26 DIAGNOSIS — Z79899 Other long term (current) drug therapy: Secondary | ICD-10-CM | POA: Diagnosis not present

## 2016-10-26 DIAGNOSIS — K219 Gastro-esophageal reflux disease without esophagitis: Secondary | ICD-10-CM | POA: Insufficient documentation

## 2016-10-26 DIAGNOSIS — G5603 Carpal tunnel syndrome, bilateral upper limbs: Secondary | ICD-10-CM | POA: Diagnosis present

## 2016-10-26 HISTORY — DX: Unspecified osteoarthritis, unspecified site: M19.90

## 2016-10-26 HISTORY — DX: Gastro-esophageal reflux disease without esophagitis: K21.9

## 2016-10-26 HISTORY — DX: Essential (primary) hypertension: I10

## 2016-10-26 HISTORY — PX: TRIGGER FINGER RELEASE: SHX641

## 2016-10-26 HISTORY — PX: CARPAL TUNNEL RELEASE: SHX101

## 2016-10-26 SURGERY — CARPAL TUNNEL RELEASE
Anesthesia: Regional | Laterality: Right

## 2016-10-26 MED ORDER — CEFAZOLIN SODIUM-DEXTROSE 2-4 GM/100ML-% IV SOLN
INTRAVENOUS | Status: AC
  Filled 2016-10-26: qty 100

## 2016-10-26 MED ORDER — ONDANSETRON HCL 4 MG/2ML IJ SOLN
INTRAMUSCULAR | Status: AC
  Filled 2016-10-26: qty 2

## 2016-10-26 MED ORDER — CEFAZOLIN SODIUM-DEXTROSE 2-4 GM/100ML-% IV SOLN
2.0000 g | INTRAVENOUS | Status: AC
  Administered 2016-10-26: 2 g via INTRAVENOUS

## 2016-10-26 MED ORDER — FENTANYL CITRATE (PF) 100 MCG/2ML IJ SOLN
INTRAMUSCULAR | Status: AC
  Filled 2016-10-26: qty 2

## 2016-10-26 MED ORDER — CHLORHEXIDINE GLUCONATE 4 % EX LIQD: 60.0000 mL | Freq: Once | CUTANEOUS | Status: DC

## 2016-10-26 MED ORDER — MIDAZOLAM HCL 5 MG/5ML IJ SOLN
INTRAMUSCULAR | Status: DC | PRN
  Administered 2016-10-26: 2 mg via INTRAVENOUS

## 2016-10-26 MED ORDER — MIDAZOLAM HCL 2 MG/2ML IJ SOLN
INTRAMUSCULAR | Status: AC
  Filled 2016-10-26: qty 2

## 2016-10-26 MED ORDER — ONDANSETRON HCL 4 MG/2ML IJ SOLN
INTRAMUSCULAR | Status: DC | PRN
  Administered 2016-10-26: 4 mg via INTRAVENOUS

## 2016-10-26 MED ORDER — FENTANYL CITRATE (PF) 100 MCG/2ML IJ SOLN
INTRAMUSCULAR | Status: DC | PRN
  Administered 2016-10-26 (×2): 50 ug via INTRAVENOUS

## 2016-10-26 MED ORDER — FENTANYL CITRATE (PF) 100 MCG/2ML IJ SOLN: 50.0000 ug | INTRAMUSCULAR | Status: DC | PRN

## 2016-10-26 MED ORDER — LIDOCAINE HCL (CARDIAC) 20 MG/ML IV SOLN
INTRAVENOUS | Status: DC | PRN
  Administered 2016-10-26: 40 mg via INTRAVENOUS

## 2016-10-26 MED ORDER — MIDAZOLAM HCL 2 MG/2ML IJ SOLN: 1.0000 mg | INTRAMUSCULAR | Status: DC | PRN

## 2016-10-26 MED ORDER — PROPOFOL 10 MG/ML IV BOLUS
INTRAVENOUS | Status: DC | PRN
  Administered 2016-10-26 (×4): 20 mg via INTRAVENOUS

## 2016-10-26 MED ORDER — LIDOCAINE HCL (PF) 0.5 % IJ SOLN
INTRAMUSCULAR | Status: DC | PRN
  Administered 2016-10-26: 30 mL via INTRAVENOUS

## 2016-10-26 MED ORDER — HYDROCODONE-ACETAMINOPHEN 5-325 MG PO TABS: 1.0000 | ORAL_TABLET | Freq: Four times a day (QID) | ORAL | 0 refills | Status: DC | PRN

## 2016-10-26 MED ORDER — FENTANYL CITRATE (PF) 100 MCG/2ML IJ SOLN: 25.0000 ug | INTRAMUSCULAR | Status: DC | PRN

## 2016-10-26 MED ORDER — BUPIVACAINE HCL (PF) 0.25 % IJ SOLN
INTRAMUSCULAR | Status: DC | PRN
  Administered 2016-10-26: 8 mL

## 2016-10-26 MED ORDER — LACTATED RINGERS IV SOLN
INTRAVENOUS | Status: DC
  Administered 2016-10-26: 11:00:00 via INTRAVENOUS

## 2016-10-26 MED ORDER — SCOPOLAMINE 1 MG/3DAYS TD PT72: 1.0000 | MEDICATED_PATCH | Freq: Once | TRANSDERMAL | Status: DC | PRN

## 2016-10-26 MED ORDER — LIDOCAINE 2% (20 MG/ML) 5 ML SYRINGE
INTRAMUSCULAR | Status: AC
  Filled 2016-10-26: qty 5

## 2016-10-26 SURGICAL SUPPLY — 37 items
BANDAGE COBAN STERILE 2 (GAUZE/BANDAGES/DRESSINGS) ×2 IMPLANT
BLADE SURG 15 STRL LF DISP TIS (BLADE) ×1 IMPLANT
BLADE SURG 15 STRL SS (BLADE) ×2
BNDG CMPR 9X4 STRL LF SNTH (GAUZE/BANDAGES/DRESSINGS)
BNDG COHESIVE 3X5 TAN STRL LF (GAUZE/BANDAGES/DRESSINGS) ×2 IMPLANT
BNDG ESMARK 4X9 LF (GAUZE/BANDAGES/DRESSINGS) IMPLANT
BNDG GAUZE ELAST 4 BULKY (GAUZE/BANDAGES/DRESSINGS) ×2 IMPLANT
CHLORAPREP W/TINT 26ML (MISCELLANEOUS) ×2 IMPLANT
CORD BIPOLAR FORCEPS 12FT (ELECTRODE) ×2 IMPLANT
COVER BACK TABLE 60X90IN (DRAPES) ×2 IMPLANT
COVER MAYO STAND STRL (DRAPES) ×2 IMPLANT
CUFF TOURNIQUET SINGLE 18IN (TOURNIQUET CUFF) ×2 IMPLANT
DECANTER SPIKE VIAL GLASS SM (MISCELLANEOUS) IMPLANT
DRAPE EXTREMITY T 121X128X90 (DRAPE) ×2 IMPLANT
DRAPE SURG 17X23 STRL (DRAPES) ×2 IMPLANT
DRSG PAD ABDOMINAL 8X10 ST (GAUZE/BANDAGES/DRESSINGS) ×2 IMPLANT
GAUZE SPONGE 4X4 12PLY STRL (GAUZE/BANDAGES/DRESSINGS) ×2 IMPLANT
GAUZE XEROFORM 1X8 LF (GAUZE/BANDAGES/DRESSINGS) ×2 IMPLANT
GLOVE BIOGEL PI IND STRL 7.0 (GLOVE) IMPLANT
GLOVE BIOGEL PI IND STRL 8.5 (GLOVE) ×1 IMPLANT
GLOVE BIOGEL PI INDICATOR 7.0 (GLOVE) ×1
GLOVE BIOGEL PI INDICATOR 8.5 (GLOVE) ×1
GLOVE SURG ORTHO 8.0 STRL STRW (GLOVE) ×2 IMPLANT
GOWN STRL REUS W/ TWL LRG LVL3 (GOWN DISPOSABLE) ×1 IMPLANT
GOWN STRL REUS W/TWL LRG LVL3 (GOWN DISPOSABLE) ×2
GOWN STRL REUS W/TWL XL LVL3 (GOWN DISPOSABLE) ×2 IMPLANT
NDL PRECISIONGLIDE 27X1.5 (NEEDLE) ×1 IMPLANT
NEEDLE PRECISIONGLIDE 27X1.5 (NEEDLE) ×2 IMPLANT
NS IRRIG 1000ML POUR BTL (IV SOLUTION) ×2 IMPLANT
PACK BASIN DAY SURGERY FS (CUSTOM PROCEDURE TRAY) ×2 IMPLANT
STOCKINETTE 4X48 STRL (DRAPES) ×2 IMPLANT
SUT ETHILON 4 0 PS 2 18 (SUTURE) ×2 IMPLANT
SUT VICRYL 4-0 PS2 18IN ABS (SUTURE) IMPLANT
SYR BULB 3OZ (MISCELLANEOUS) ×2 IMPLANT
SYR CONTROL 10ML LL (SYRINGE) ×2 IMPLANT
TOWEL OR 17X24 6PK STRL BLUE (TOWEL DISPOSABLE) ×4 IMPLANT
UNDERPAD 30X30 (UNDERPADS AND DIAPERS) ×2 IMPLANT

## 2016-10-26 NOTE — Transfer of Care (Signed)
Immediate Anesthesia Transfer of Care Note  Patient: [name]  Procedure(s) Performed: Procedure(s): RIGHT CARPAL TUNNEL RELEASE (Right) RELEASE TRIGGER FINGER/A-1 PULLEY RIGHT MIDDLE FINGER (Right)  Patient Location: PACU  Anesthesia Type:Bier block  Level of Consciousness:  sedated, patient cooperative and responds to stimulation  Airway & Oxygen Therapy:Patient Spontanous Breathing and Patient connected to face mask oxgen  Post-op Assessment:  Report given to PACU RN and Post -op Vital signs reviewed and stable  Post vital signs:  Reviewed and stable  Last Vitals:  Vitals:   10/26/16 1017  BP: 135/80  Pulse: 67  Resp: 18  Temp: 36.8 C  SpO2: 100%    Complications: No apparent anesthesia complications

## 2016-10-26 NOTE — Anesthesia Preprocedure Evaluation (Signed)
Anesthesia Evaluation  Patient identified by MRN, date of birth, ID band Patient awake    Reviewed: Allergy & Precautions, H&P , Patient's Chart, lab work & pertinent test results, reviewed documented beta blocker date and time   Airway Mallampati: II  TM Distance: >3 FB Neck ROM: full    Dental no notable dental hx.    Pulmonary    Pulmonary exam normal breath sounds clear to auscultation       Cardiovascular hypertension, On Medications  Rhythm:regular Rate:Normal     Neuro/Psych    GI/Hepatic   Endo/Other    Renal/GU      Musculoskeletal   Abdominal   Peds  Hematology   Anesthesia Other Findings   Reproductive/Obstetrics                             Anesthesia Physical Anesthesia Plan  ASA: II  Anesthesia Plan: Bier Block   Post-op Pain Management:    Induction: Intravenous  PONV Risk Score and Plan: 1 and Ondansetron and Dexamethasone  Airway Management Planned: Mask  Additional Equipment:   Intra-op Plan:   Post-operative Plan:   Informed Consent: I have reviewed the patients History and Physical, chart, labs and discussed the procedure including the risks, benefits and alternatives for the proposed anesthesia with the patient or authorized representative who has indicated his/her understanding and acceptance.   Dental Advisory Given  Plan Discussed with: CRNA and Surgeon  Anesthesia Plan Comments: (  )        Anesthesia Quick Evaluation

## 2016-10-26 NOTE — Op Note (Signed)
Dictation Number [id number]

## 2016-10-26 NOTE — H&P (Addendum)
[name] is an 56 y.o. female.   Chief Complaint: numbness right hand and catching middle finger HPI: [name] is a 56 year old right-hand-dominant female who is referred by Dr. Juanetta Gibbs for consultation regarding numbness and tingling right greater than left hands. Her right side is been going on for years her left side more recently. She states this been at least 3 years on her right side. She complains of thumb to ring fingers with occasional pain in the palm of her hand especially in the morning with a feeling like a tendons being ripped. She complains of cramping pain. This has a VAS score 7-8/10 when it occurs. She says using a fork Q-tips are curling iron gripping and working on pastry increases symptoms for her. He has straight seems to help. She has no history of injury to the hand or to the neck. She is waking 7 out of 7 nights. She has had no treatment. She has had nerve conductions done by Dr. Marjory Lies revealing significant carpal tunnel syndrome on her right side less significant on her left side with a motor delay of 8.4 on the right 4.5 on the left. She has no history diabetes thyroid problems arthritis gout.       Past Medical History:  Diagnosis Date  . Arthritis   . GERD (gastroesophageal reflux disease)   . Hypertension     Past Surgical History:  Procedure Laterality Date  . ABDOMINAL HYSTERECTOMY  1991-1992   [facility]  . COLONOSCOPY N/A 07/13/2012   Procedure: COLONOSCOPY;  Surgeon: Corbin Ade, MD;  Location[facility];  Service: Endoscopy;  Laterality: N/A;  2:30 PM-moved to [address] to notify pt  . WISDOM TOOTH EXTRACTION      Family History  Problem Relation Age of Onset  . Prostate cancer Father   . Heart disease Father   . Diabetes Maternal Grandmother   . Diabetes Paternal Grandmother   . Colon cancer Neg Hx    Social History:  reports that she has never smoked. She has never used smokeless tobacco. She reports that she drinks alcohol. She reports  that she does not use drugs.  Allergies: No Known Allergies  Medications Prior to Admission  Medication Sig Dispense Refill  . Biotin 1000 MCG tablet Take 1,000 mcg by mouth daily.     . famotidine (PEPCID) 20 MG tablet Take 1 tablet (20 mg total) by mouth 2 (two) times daily. 30 tablet 0  . ferrous sulfate 325 (65 FE) MG tablet Take 325 mg by mouth daily with breakfast.    . fish oil-omega-3 fatty acids 1000 MG capsule Take 1,200 mg by mouth daily.    . Glucosamine-Chondroitin (GLUCOSAMINE CHONDR COMPLEX PO) Take 500 mg by mouth daily.    . metoprolol succinate (TOPROL-XL) 25 MG 24 hr tablet Take 25 mg by mouth daily.     . Multiple Vitamin (MULTIVITAMIN WITH MINERALS) TABS tablet Take 1 tablet by mouth daily.    . Multiple Vitamin (MULTIVITAMIN) capsule Take 1 capsule by mouth daily.    . Turmeric 450 MG CAPS Take 1 capsule by mouth daily.      No results found for this or any previous visit (from the past 48 hour(s)).  No results found.   Pertinent items are noted in HPI.  Height  (1.626 m), weight 93 kg (205 lb).  General appearance: alert, cooperative and appears stated age Head: Normocephalic, without obvious abnormality Neck: no JVD Resp: clear to auscultation bilaterally Cardio:  regular rate and rhythm, S1, S2 normal, no murmur, click, rub or gallop GI: soft, non-tender; bowel sounds normal; no masses,  no organomegaly Extremities: numbness right hand and catching middle finger Pulses: 2+ and symmetric Skin: Skin color, texture, turgor normal. No rashes or lesions Neurologic: Grossly normal Incision/Wound: na  Assessment/Plan  Assessment:  1. Bilateral carpal tunnel syndrome  2. Trigger middle finger of right hand    Plan: We have discussed the etiology of carpal tunnel syndrome trigger fingers with her. Would recommend serious consideration be given to release the A1 pulley of her right middle finger carpal tunnel release right hand. She would like to  proceed to have that done. Prepare postoperative course are discussed along with risk complications. She is aware that there is no guarantee to the surgery the possibility of infection recurrence injury to arteries nerves tendons complete relief symptoms and dystrophy. Her medical records nerve conductions are reviewed with her. Questions are encouraged and answered her satisfaction. She is scheduled for release right carpal tunnel and A1 pulley right middle fingers and outpatient under regional anesthesia.      Cataleah Stites R 10/26/2016, 10:17 AM

## 2016-10-26 NOTE — Op Note (Signed)
[name], [name]                 ACCOUNT NO.:  [id number]  MEDICAL RECORD NO.:  [id number]  LOCATION:                                 FACILITY:  PHYSICIAN:  Cindee Salt, M.D.       DATE OF BIRTH:  27-Oct-1960  DATE OF PROCEDURE:  10/26/2016 DATE OF DISCHARGE:                              OPERATIVE REPORT   PREOPERATIVE DIAGNOSES:  Carpal tunnel syndrome, right hand.  Stenosing tenosynovitis, right middle finger.  POSTOPERATIVE DIAGNOSES:  Carpal tunnel syndrome, right hand.  Stenosing tenosynovitis, right middle finger.  OPERATION:  Release of carpal canal with release of A1 pulley, right middle finger.  SURGEON:  Cindee Salt, M.D.  ASSISTANT:  None.  ANESTHESIA:  Forearm-based IV regional with local infiltration and IV sedation.  PLACE OF SURGERY:  [facility].  ANESTHESIOLOGIST:  Dr. Jean Rosenthal.  HISTORY:  The patient is a 56 year old female with a history of carpal tunnel syndrome, nerve conduction is positive.  Triggering of her right middle finger, which has not responded to conservative treatment.  She has elected to undergo surgical release of the A1 pulley of the right middle finger and carpal tunnel release of her right hand.  Pre, peri, and postoperative plans have been discussed with her.  She is aware that there is no guarantee with the surgery; the possibility of infection; recurrence of injury to arteries, nerves, tendons; incomplete relief of symptoms; and dystrophy.  In the preoperative area, the patient was seen, the extremity marked by both the patient and surgeon.  Antibiotic given.  DESCRIPTION OF PROCEDURE:  The patient was brought to the operating room, where a forearm-based IV regional anesthetic was carried out without difficulty under the direction of the Anesthesia Department. She was prepped using ChloraPrep in supine position with the right arm free.  A 3-minute dry time was allowed.  Time-out taken, confirming the patient and  procedure.  An oblique incision was made over the A1 pulley of the right middle finger.  This was carried down through subcutaneous tissue.  Bleeders were electrocauterized with bipolar.  The neurovascular bundles radially and ulnarly were identified and protected with retractors.  The A1 pulley was then released on its radial aspect. A small incision was made centrally in A2.  The tenosynovial tissue proximally was dissected free and the two tendons separated to be certain there was no further adhesions between the two.  The finger was placed through a full range of motion.  No further triggering was noted. The wound was copiously irrigated with saline.  The skin was closed with interrupted 4-0 nylon sutures.  A separate incision was then made longitudinally in the palm.  This was carried down through the subcutaneous tissue.  Bleeders were electrocauterized with bipolar.  The palmar fascia was split.  The superficial palmar arch was identified. Flexor tendon of the ring and little finger were identified.  Retractors were placed protecting the median nerve radially along with the tendons and the ulnar nerve ulnarly.  Flexor retinaculum was then released on its ulnar aspect.  A right angle and Sewell retractor were placed between the skin and forearm fascia.  The underlying tissue was  then dissected free with blunt dissection.  The proximal aspect of the flexor retinaculum and distal forearm fascia was then released for approximately 2 cm proximal to the wrist crease under direct vision. The canal was explored.  Tenosynovium tissue was markedly thickened. The area of compression to the nerve was apparent.  Motor branch entered into the muscle distally.  No further lesions were identified.  The wound was irrigated with saline.  The skin was closed with interrupted 4- 0 nylon sutures.  Local infiltration to the two wounds was then given with 0.25% bupivacaine without epinephrine, 8 mL total  were used.  A sterile compressive dressing with the fingers and thumb free was applied.  On deflation of the tourniquet, all fingers immediately pinked.  She was taken to the recovery room for observation in satisfactory condition.  She will be discharged home to return to the [facility] [place] in 1 week, on [place].          ______________________________ Cindee Salt, M.D.     GK/MEDQ  D:  10/26/2016  T:  10/26/2016  Job:  [id number]

## 2016-10-26 NOTE — Discharge Instructions (Addendum)

## 2016-10-26 NOTE — Brief Op Note (Signed)
10/26/2016  12:01 PM  PATIENT:  [name]  56 y.o. female  PRE-OPERATIVE DIAGNOSIS:  * No pre-op diagnosis entered *  POST-OPERATIVE DIAGNOSIS:  Right Carpal Tunnel Syndrome Trigger Right Middle Finger  G56.01  M56.331  PROCEDURE:  Procedure(s): RIGHT CARPAL TUNNEL RELEASE (Right) RELEASE TRIGGER FINGER/A-1 PULLEY RIGHT MIDDLE FINGER (Right)  SURGEON:  Surgeon(s) and Role:    Cindee Salt, MD - Primary  PHYSICIAN ASSISTANT:   ASSISTANTS: none   ANESTHESIA:   local and regional  EBL:  Total I/O In: 700 [I.V.:700] Out: 0   BLOOD ADMINISTERED:none  DRAINS: none   LOCAL MEDICATIONS USED:  BUPIVICAINE   SPECIMEN:  No Specimen  DISPOSITION OF SPECIMEN:  N/A  COUNTS:  YES  TOURNIQUET:   Total Tourniquet Time Documented: Forearm (Right) - 24 minutes Total: Forearm (Right) - 24 minutes   DICTATION: .Other Dictation: Dictation Number [id number]  PLAN OF CARE: Discharge to home after PACU  PATIENT DISPOSITION:  PACU - hemodynamically stable.

## 2016-10-27 ENCOUNTER — Encounter (HOSPITAL_BASED_OUTPATIENT_CLINIC_OR_DEPARTMENT_OTHER): Payer: Self-pay | Admitting: Orthopedic Surgery

## 2016-10-28 NOTE — Anesthesia Postprocedure Evaluation (Signed)
Anesthesia Post Note  Patient: [name]  Procedure(s) Performed: RIGHT CARPAL TUNNEL RELEASE (Right ) RELEASE TRIGGER FINGER/A-1 PULLEY RIGHT MIDDLE FINGER (Right )     Patient location during evaluation: PACU Anesthesia Type: MAC and Bier Block Level of consciousness: awake and alert Pain management: pain level controlled Vital Signs Assessment: post-procedure vital signs reviewed and stable Respiratory status: spontaneous breathing, nonlabored ventilation, respiratory function stable and patient connected to nasal cannula oxygen Cardiovascular status: stable and blood pressure returned to baseline Postop Assessment: no apparent nausea or vomiting Anesthetic complications: no    Last Vitals:  Vitals:   10/26/16 1230 10/26/16 1239  BP:  119/76  Pulse: (!) 53 65  Resp: 11 18  Temp:  36.6 C  SpO2: 98% 97%    Last Pain:  Vitals:   10/27/16 0905  TempSrc:   PainSc: 1                  Loras Grieshop EDWARD

## 2016-11-05 ENCOUNTER — Encounter (HOSPITAL_BASED_OUTPATIENT_CLINIC_OR_DEPARTMENT_OTHER): Payer: Self-pay | Admitting: Orthopedic Surgery

## 2016-11-11 ENCOUNTER — Other Ambulatory Visit: Payer: Self-pay | Admitting: Pulmonary Disease

## 2016-11-11 DIAGNOSIS — Z1231 Encounter for screening mammogram for malignant neoplasm of breast: Secondary | ICD-10-CM

## 2016-12-06 ENCOUNTER — Ambulatory Visit
Admission: RE | Admit: 2016-12-06 | Discharge: 2016-12-06 | Disposition: A | Discharge status: Home / Self Care | Payer: Managed Care, Other (non HMO) | Source: Ambulatory Visit | Attending: Pulmonary Disease | Admitting: Pulmonary Disease

## 2016-12-06 DIAGNOSIS — Z1231 Encounter for screening mammogram for malignant neoplasm of breast: Secondary | ICD-10-CM

## 2017-10-27 ENCOUNTER — Other Ambulatory Visit: Payer: Self-pay | Admitting: Pulmonary Disease

## 2017-10-27 DIAGNOSIS — Z1231 Encounter for screening mammogram for malignant neoplasm of breast: Secondary | ICD-10-CM

## 2017-12-12 ENCOUNTER — Ambulatory Visit
Admission: RE | Admit: 2017-12-12 | Discharge: 2017-12-12 | Disposition: A | Discharge status: Home / Self Care | Payer: Managed Care, Other (non HMO) | Source: Ambulatory Visit | Attending: Pulmonary Disease | Admitting: Pulmonary Disease

## 2017-12-12 DIAGNOSIS — Z1231 Encounter for screening mammogram for malignant neoplasm of breast: Secondary | ICD-10-CM

## 2017-12-13 LAB — HM MAMMOGRAPHY

## 2018-08-22 ENCOUNTER — Ambulatory Visit (HOSPITAL_COMMUNITY)
Admission: RE | Admit: 2018-08-22 | Discharge: 2018-08-22 | Disposition: A | Discharge status: Home / Self Care | Payer: Managed Care, Other (non HMO) | Source: Ambulatory Visit | Attending: Pulmonary Disease | Admitting: Pulmonary Disease

## 2018-08-22 ENCOUNTER — Other Ambulatory Visit: Payer: Self-pay

## 2018-08-22 ENCOUNTER — Other Ambulatory Visit (HOSPITAL_COMMUNITY): Payer: Self-pay | Admitting: Pulmonary Disease

## 2018-08-22 DIAGNOSIS — M25551 Pain in right hip: Secondary | ICD-10-CM

## 2018-08-22 DIAGNOSIS — M5441 Lumbago with sciatica, right side: Secondary | ICD-10-CM

## 2018-08-24 LAB — COMPREHENSIVE METABOLIC PANEL
Calcium: 9.3 (ref 8.7–10.7)
GFR calc Af Amer: 94
GFR calc non Af Amer: 81

## 2018-08-24 LAB — BASIC METABOLIC PANEL
BUN: 18 (ref 4–21)
CO2: 25 — AB (ref 13–22)
Chloride: 107 (ref 99–108)
Creatinine: 0.8 (ref <=1.1)
Glucose: 97
Potassium: 4.3 (ref 3.4–5.3)
Sodium: 142 (ref 137–147)

## 2018-08-24 LAB — CBC AND DIFFERENTIAL
HCT: 37 (ref 36–46)
Hemoglobin: 12.3 (ref 12.0–16.0)
Platelets: 263 (ref 150–399)
WBC: 4.3

## 2018-08-24 LAB — CBC: RBC: 3.99 (ref 3.87–5.11)

## 2018-08-31 ENCOUNTER — Other Ambulatory Visit (HOSPITAL_COMMUNITY): Payer: Self-pay | Admitting: Pulmonary Disease

## 2018-08-31 ENCOUNTER — Other Ambulatory Visit: Payer: Self-pay | Admitting: Pulmonary Disease

## 2018-08-31 DIAGNOSIS — M25551 Pain in right hip: Secondary | ICD-10-CM

## 2018-09-07 ENCOUNTER — Ambulatory Visit (HOSPITAL_COMMUNITY): Payer: Managed Care, Other (non HMO)

## 2018-09-12 ENCOUNTER — Ambulatory Visit (HOSPITAL_COMMUNITY): Payer: Managed Care, Other (non HMO)

## 2018-09-28 ENCOUNTER — Other Ambulatory Visit (HOSPITAL_COMMUNITY): Payer: Self-pay | Admitting: Pulmonary Disease

## 2018-09-28 DIAGNOSIS — Z780 Asymptomatic menopausal state: Secondary | ICD-10-CM

## 2018-10-19 ENCOUNTER — Other Ambulatory Visit: Payer: Self-pay

## 2018-10-19 ENCOUNTER — Ambulatory Visit (HOSPITAL_COMMUNITY)
Admission: RE | Admit: 2018-10-19 | Discharge: 2018-10-19 | Disposition: A | Discharge status: Home / Self Care | Payer: Managed Care, Other (non HMO) | Source: Ambulatory Visit | Attending: Pulmonary Disease | Admitting: Pulmonary Disease

## 2018-10-19 DIAGNOSIS — Z780 Asymptomatic menopausal state: Secondary | ICD-10-CM | POA: Diagnosis not present

## 2018-10-19 LAB — HM DEXA SCAN: HM Dexa Scan: NORMAL

## 2018-11-06 ENCOUNTER — Other Ambulatory Visit: Payer: Self-pay

## 2018-11-06 DIAGNOSIS — Z20822 Contact with and (suspected) exposure to covid-19: Secondary | ICD-10-CM

## 2018-11-07 LAB — NOVEL CORONAVIRUS, NAA: SARS-CoV-2, NAA: NOT DETECTED

## 2018-12-26 LAB — LIPID PANEL
Cholesterol: 180 (ref 0–200)
HDL: 71 — AB (ref 35–70)
LDL Cholesterol: 92
Triglycerides: 77 (ref 40–160)

## 2018-12-26 LAB — HEPATIC FUNCTION PANEL
ALT: 20 (ref 7–35)
AST: 17 (ref 13–35)
Alkaline Phosphatase: 61 (ref 25–125)

## 2018-12-26 LAB — COMPREHENSIVE METABOLIC PANEL
Albumin: 4.2 (ref 3.5–5.0)
Globulin: 2.3

## 2018-12-29 ENCOUNTER — Other Ambulatory Visit: Payer: Self-pay | Admitting: Pulmonary Disease

## 2018-12-29 DIAGNOSIS — Z1231 Encounter for screening mammogram for malignant neoplasm of breast: Secondary | ICD-10-CM

## 2019-01-07 DIAGNOSIS — E669 Obesity, unspecified: Secondary | ICD-10-CM | POA: Insufficient documentation

## 2019-01-07 DIAGNOSIS — E785 Hyperlipidemia, unspecified: Secondary | ICD-10-CM | POA: Insufficient documentation

## 2019-01-07 DIAGNOSIS — R002 Palpitations: Secondary | ICD-10-CM | POA: Insufficient documentation

## 2019-01-07 DIAGNOSIS — L439 Lichen planus, unspecified: Secondary | ICD-10-CM

## 2019-01-07 DIAGNOSIS — D649 Anemia, unspecified: Secondary | ICD-10-CM | POA: Insufficient documentation

## 2019-01-07 DIAGNOSIS — R079 Chest pain, unspecified: Secondary | ICD-10-CM | POA: Insufficient documentation

## 2019-02-19 ENCOUNTER — Ambulatory Visit
Admission: RE | Admit: 2019-02-19 | Discharge: 2019-02-19 | Disposition: A | Discharge status: Home / Self Care | Payer: Managed Care, Other (non HMO) | Source: Ambulatory Visit | Attending: Pulmonary Disease | Admitting: Pulmonary Disease

## 2019-02-19 ENCOUNTER — Other Ambulatory Visit: Payer: Self-pay

## 2019-02-19 DIAGNOSIS — Z1231 Encounter for screening mammogram for malignant neoplasm of breast: Secondary | ICD-10-CM

## 2019-03-26 ENCOUNTER — Ambulatory Visit: Payer: Managed Care, Other (non HMO) | Attending: Internal Medicine

## 2019-03-26 ENCOUNTER — Other Ambulatory Visit: Payer: Self-pay

## 2019-03-26 DIAGNOSIS — Z20822 Contact with and (suspected) exposure to covid-19: Secondary | ICD-10-CM

## 2019-03-28 LAB — NOVEL CORONAVIRUS, NAA: SARS-CoV-2, NAA: DETECTED — AB

## 2019-03-29 ENCOUNTER — Telehealth: Payer: Self-pay | Admitting: Physician Assistant

## 2019-03-29 NOTE — Telephone Encounter (Signed)
Called to discuss with patient about Covid symptoms and the use of bamlanivimab or casirivimab/imdevimab, a monoclonal antibody infusion for those with mild to moderate Covid symptoms and at a high risk of hospitalization.  Pt is qualified for this infusion at the [facility] infusion center due to Age >55 with HTN.    Message left to call back  Mohammad Granade PA-C  [facility]    

## 2019-03-30 ENCOUNTER — Telehealth: Payer: Self-pay | Admitting: Adult Health

## 2019-03-30 NOTE — Telephone Encounter (Signed)
Called to discuss with patient about Covid symptoms and the use of bamlanivimab or casirivimab/imdevimab, a monoclonal antibody infusion for those with mild to moderate Covid symptoms and at a high risk of hospitalization.  Pt is qualified for this infusion at the [facility] infusion center due to Age >55 with HTN   Message left to call back.  MyChart message previously sent.  William Hamburger, NP-C

## 2019-04-19 ENCOUNTER — Ambulatory Visit: Payer: Managed Care, Other (non HMO) | Admitting: Family Medicine

## 2019-10-17 ENCOUNTER — Other Ambulatory Visit: Payer: Self-pay

## 2019-10-17 ENCOUNTER — Ambulatory Visit: Payer: Managed Care, Other (non HMO) | Admitting: Cardiology

## 2019-10-17 ENCOUNTER — Encounter: Payer: Self-pay | Admitting: Cardiology

## 2019-10-17 VITALS — BP 118/64 | HR 98 | Ht 64.0 in | Wt 183.0 lb

## 2019-10-17 DIAGNOSIS — R0789 Other chest pain: Secondary | ICD-10-CM | POA: Diagnosis not present

## 2019-10-17 DIAGNOSIS — Z8679 Personal history of other diseases of the circulatory system: Secondary | ICD-10-CM

## 2019-10-17 DIAGNOSIS — R0602 Shortness of breath: Secondary | ICD-10-CM | POA: Diagnosis not present

## 2019-10-17 DIAGNOSIS — R079 Chest pain, unspecified: Secondary | ICD-10-CM | POA: Diagnosis not present

## 2019-10-17 DIAGNOSIS — E782 Mixed hyperlipidemia: Secondary | ICD-10-CM

## 2019-10-17 NOTE — Patient Instructions (Signed)
Medication Instructions:  Your physician recommends that you continue on your current medications as directed. Please refer to the Current Medication list given to you today.  *If you need a refill on your cardiac medications before your next appointment, please call your pharmacy*   Lab Work: None today  If you have labs (blood work) drawn today and your tests are completely normal, you will receive your results only by: MyChart Message (if you have MyChart) OR A paper copy in the mail If you have any lab test that is abnormal or we need to change your treatment, we will call you to review the results.   Testing/Procedures: Your physician has requested that you have an echocardiogram. Echocardiography is a painless test that uses sound waves to create images of your heart. It provides your doctor with information about the size and shape of your heart and how well your heart's chambers and valves are working. This procedure takes approximately one hour. There are no restrictions for this procedure.   Your physician has requested that you have a lexiscan myoview. For further information please visit [email or web address]. Please follow instruction sheet, as given.    Follow-Up: At [facility], you and your health needs are our priority.  As part of our continuing mission to provide you with exceptional heart care, we have created designated Provider Care Teams.  These Care Teams include your primary Cardiologist (physician) and Advanced Practice Providers (APPs -  Physician Assistants and Nurse Practitioners) who all work together to provide you with the care you need, when you need it.  We recommend signing up for the patient portal called "MyChart".  Sign up information is provided on this After Visit Summary.  MyChart is used to connect with patients for Virtual Visits (Telemedicine).  Patients are able to view lab/test results, encounter notes, upcoming appointments, etc.  Non-urgent  messages can be sent to your provider as well.   To learn more about what you can do with MyChart, go to [email or web address].    Your next appointment:  We will call you with test results          Thank you for choosing [facility] !       

## 2019-10-17 NOTE — Progress Notes (Signed)
Cardiology Office Note  Date: 10/17/2019   ID: [name], DOB [birth year], MRN [id number]  PCP:  Benita Stabile, MD  Cardiologist:  Nona Dell, MD Electrophysiologist:  None   Chief Complaint  Patient presents with   Chest Pain    History of Present Illness: [name] is a 59 y.o. female referred for cardiology consultation by Mr. Wallace Cullens NP with Dr. Margo Aye for the evaluation of chest pain.  I reviewed the available records.  She states that approximately 2 or 3 weeks ago she noticed that she was feeling fatigued and unrested when she got out of bed in the morning, this was ultimately followed by episodes of recurring chest pressure, initially fairly intense and lasting over a weekend, then occurring more sporadically and to milder degree.  No associated exertional component, no increasing reflux or dysphagia.  She has felt somewhat short of breath during this time, no cough.  She was seen by Dr. Wyline Mood in 2018 for evaluation of chest pain.  Symptoms were felt to be potentially GI in etiology, no ischemic testing undertaken at that point.  She did undergo an echocardiogram which revealed LVEF 55 to 60% and no major valvular abnormalities.  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  Also reports having COVID-19 back in March, quarantine at home with relatively mild symptoms.  She did have subsequent recurring headaches.  I reviewed her medications which are outlined below.  I personally reviewed her ECG today which shows sinus rhythm with nonspecific T wave changes.  Past Medical History:  Diagnosis Date   Anemia    Anxiety    Arthritis    Essential hypertension    GERD (gastroesophageal reflux disease)    Lumbago    Mixed hyperlipidemia     Past Surgical History:  Procedure Laterality Date   ABDOMINAL HYSTERECTOMY  1991-1992   [facility]   CARPAL TUNNEL RELEASE Right 10/26/2016   Procedure: RIGHT CARPAL TUNNEL RELEASE;   Surgeon: Cindee Salt, MD;  Location: [facility];  Service: Orthopedics;  Laterality: Right;   COLONOSCOPY N/A 07/13/2012   Procedure: COLONOSCOPY;  Surgeon: Corbin Ade, MD;  [facility];  Service: Endoscopy;  Laterality: N/A;  2:30 PM-moved to [address] to notify pt   TRIGGER FINGER RELEASE Right 10/26/2016   Procedure: RELEASE TRIGGER FINGER/A-1 PULLEY RIGHT MIDDLE FINGER;  Surgeon: Cindee Salt, MD;  Location: [facility];  Service: Orthopedics;  Laterality: Right;   WISDOM TOOTH EXTRACTION      Current Outpatient Medications  Medication Sig Dispense Refill   Biotin 1000 MCG tablet Take 1,000 mcg by mouth daily.      famotidine (PEPCID) 20 MG tablet Take 1 tablet (20 mg total) by mouth 2 (two) times daily. 30 tablet 0   ferrous sulfate 325 (65 FE) MG tablet Take 325 mg by mouth daily with breakfast.     fish oil-omega-3 fatty acids 1000 MG capsule Take 1,200 mg by mouth daily.     LYSINE PO Take by mouth.     magnesium 30 MG tablet Take 30 mg by mouth 2 (two) times daily.     Multiple Vitamin (MULTIVITAMIN WITH MINERALS) TABS tablet Take 1 tablet by mouth daily.     Multiple Vitamin (MULTIVITAMIN) capsule Take 1 capsule by mouth daily.     Turmeric 450 MG CAPS Take 1 capsule by mouth daily.     No current facility-administered medications for  this visit.   Allergies:  Patient has no known allergies.   Social History: The patient  reports that she has never smoked. She has never used smokeless tobacco. She reports current alcohol use. She reports that she does not use drugs.   Family History: The patient's family history includes Breast cancer in her paternal aunt; Diabetes in her maternal grandmother and paternal grandmother; Heart disease in her father; Prostate cancer in her father.  ROS:   No recent sense of palpitations or syncope.  Physical Exam: VS:  BP 118/64 (BP Location: Right Arm)    Pulse 98    Ht 5\' 4"  (1.626 m)     Wt 183 lb (83 kg)    SpO2 97%    BMI 31.41 kg/m , BMI Body mass index is 31.41 kg/m.  Wt Readings from Last 3 Encounters:  10/17/19 183 lb (83 kg)  10/20/16 205 lb (93 kg)  06/01/16 205 lb (93 kg)    General: Patient appears comfortable at rest. HEENT: Conjunctiva and lids normal, wearing a mask. Neck: Supple, no elevated JVP or carotid bruits, no thyromegaly. Lungs: Clear to auscultation, nonlabored breathing at rest. Cardiac: Regular rate and rhythm, no S3 or significant systolic murmur, no pericardial rub. Abdomen: Soft, bowel sounds present. Extremities: No pitting edema, distal pulses 2+. Skin: Warm and dry. Musculoskeletal: No kyphosis. Neuropsychiatric: Alert and oriented x3, affect grossly appropriate.  ECG:  An ECG dated 04/29/2016 was personally reviewed today and demonstrated:  Sinus rhythm with PAC and nonspecific T wave changes.  Recent Labwork: 12/26/2018: ALT 20; AST 17     Component Value Date/Time   CHOL 180 12/26/2018 0000   TRIG 77 12/26/2018 0000   HDL 71 (A) 12/26/2018 0000   LDLCALC 92 12/26/2018 0000  April 2021: Hemoglobin 13.3, platelets 253, BUN 14, creatinine 0.88, potassium 4.9, AST 19, ALT 18, cholesterol 204, triglycerides 109, HDL 80, LDL 105, hemoglobin A1c 5.7%, TSH 0.537, magnesium 2.1  Other Studies Reviewed Today:  Echocardiogram 06/03/2016: - Left ventricle: The cavity size was normal. Wall thickness was  normal. Systolic function was normal. The estimated ejection  fraction was in the range of 55% to 60%. Wall motion was normal;  there were no regional wall motion abnormalities.  - Mitral valve: There was trivial regurgitation.  - Right atrium: Central venous pressure (est): 3 mm Hg.  - Tricuspid valve: There was physiologic regurgitation.  - Pulmonary arteries: Systolic pressure could not be accurately  estimated.  - Pericardium, extracardiac: There was no pericardial effusion.   Impressions:   - Normal LV wall thickness with  LVEF 55-60%. Indeterminate  diastolic function. Trivial mitral regurgitation.  Assessment and Plan:  1.  Recurring episodes of chest pressure as discussed above, mixed features from an ischemic perspective.  ECG is nonspecific.  Initial sensation of shortness of breath has improved by report, she has had no associated palpitations or syncope.  She does report having COVID-19 with relatively mild symptoms back in March.  Medical history includes both hypertension and hyperlipidemia per chart review. Plan is to obtain an echocardiogram to ensure normal cardiac structure and function, also a Lexiscan Myoview for ischemic assessment.  2.  Reported history of hypertension, blood pressure is normal today.  She is currently not on any antihypertensive medications.  Medication Adjustments/Labs and Tests Ordered: Current medicines are reviewed at length with the patient today.  Concerns regarding medicines are outlined above.   Tests Ordered: Orders Placed This Encounter  Procedures   NM Myocar  Multi W/Spect W/Wall Motion / EF   EKG 12-Lead   ECHOCARDIOGRAM COMPLETE    Medication Changes: No orders of the defined types were placed in this encounter.   Disposition:  Follow up test results.  Signed, Jonelle Sidle, MD, [facility] 10/17/2019 9:49 AM    [facility] [address] [place], [place] [address] Phone: [phone]; Fax: [phone]

## 2019-10-25 ENCOUNTER — Encounter (HOSPITAL_COMMUNITY): Payer: Managed Care, Other (non HMO)

## 2019-10-25 ENCOUNTER — Other Ambulatory Visit (HOSPITAL_COMMUNITY): Payer: Managed Care, Other (non HMO)

## 2019-12-13 ENCOUNTER — Other Ambulatory Visit: Payer: Self-pay

## 2019-12-13 ENCOUNTER — Ambulatory Visit (HOSPITAL_COMMUNITY)
Admission: RE | Admit: 2019-12-13 | Discharge: 2019-12-13 | Disposition: A | Discharge status: Home / Self Care | Payer: Managed Care, Other (non HMO) | Source: Ambulatory Visit | Attending: Cardiology | Admitting: Cardiology

## 2019-12-13 DIAGNOSIS — R0602 Shortness of breath: Secondary | ICD-10-CM | POA: Diagnosis present

## 2019-12-13 LAB — ECHOCARDIOGRAM COMPLETE
AR max vel: 2.35 cm2
AV Area VTI: 2.37 cm2
AV Area mean vel: 2.27 cm2
AV Mean grad: 6.6 mmHg
AV Peak grad: 11.3 mmHg
Ao pk vel: 1.68 m/s
Area-P 1/2: 2.77 cm2
S' Lateral: 2.2 cm

## 2019-12-13 NOTE — Progress Notes (Signed)
*  PRELIMINARY RESULTS* Echocardiogram 2D Echocardiogram has been performed.  Stacey Drain 12/13/2019, 12:50 PM

## 2019-12-24 ENCOUNTER — Encounter (HOSPITAL_BASED_OUTPATIENT_CLINIC_OR_DEPARTMENT_OTHER)
Admission: RE | Admit: 2019-12-24 | Discharge: 2019-12-24 | Disposition: A | Discharge status: Home / Self Care | Payer: Managed Care, Other (non HMO) | Source: Ambulatory Visit | Attending: Cardiology | Admitting: Cardiology

## 2019-12-24 ENCOUNTER — Encounter (HOSPITAL_COMMUNITY): Payer: Self-pay

## 2019-12-24 ENCOUNTER — Encounter (HOSPITAL_COMMUNITY)
Admission: RE | Admit: 2019-12-24 | Discharge: 2019-12-24 | Disposition: A | Discharge status: Home / Self Care | Payer: Managed Care, Other (non HMO) | Source: Ambulatory Visit | Attending: Cardiology | Admitting: Cardiology

## 2019-12-24 ENCOUNTER — Other Ambulatory Visit: Payer: Self-pay

## 2019-12-24 DIAGNOSIS — R079 Chest pain, unspecified: Secondary | ICD-10-CM | POA: Diagnosis present

## 2019-12-24 LAB — NM MYOCAR MULTI W/SPECT W/WALL MOTION / EF
LV dias vol: 72 mL (ref 46–106)
LV sys vol: 26 mL
Peak HR: 116 {beats}/min
RATE: 0.42
Rest HR: 60 {beats}/min
SDS: 2
SRS: 0
SSS: 2
TID: 1.11

## 2019-12-24 MED ORDER — TECHNETIUM TC 99M TETROFOSMIN IV KIT
10.0000 | PACK | Freq: Once | INTRAVENOUS | Status: AC | PRN
  Administered 2019-12-24: 10.3 via INTRAVENOUS

## 2019-12-24 MED ORDER — TECHNETIUM TC 99M TETROFOSMIN IV KIT
30.0000 | PACK | Freq: Once | INTRAVENOUS | Status: AC | PRN
  Administered 2019-12-24: 32 via INTRAVENOUS

## 2019-12-24 MED ORDER — SODIUM CHLORIDE FLUSH 0.9 % IV SOLN
INTRAVENOUS | Status: AC
  Administered 2019-12-24: 10 mL via INTRAVENOUS
  Filled 2019-12-24: qty 10

## 2019-12-24 MED ORDER — REGADENOSON 0.4 MG/5ML IV SOLN
INTRAVENOUS | Status: AC
  Administered 2019-12-24: 0.4 mg via INTRAVENOUS
  Filled 2019-12-24: qty 5

## 2020-03-14 ENCOUNTER — Other Ambulatory Visit: Payer: Self-pay | Admitting: Pulmonary Disease

## 2020-03-14 ENCOUNTER — Other Ambulatory Visit: Payer: Self-pay | Admitting: Internal Medicine

## 2020-03-14 DIAGNOSIS — Z1231 Encounter for screening mammogram for malignant neoplasm of breast: Secondary | ICD-10-CM

## 2020-05-02 ENCOUNTER — Other Ambulatory Visit: Payer: Self-pay

## 2020-05-02 ENCOUNTER — Ambulatory Visit
Admission: RE | Admit: 2020-05-02 | Discharge: 2020-05-02 | Disposition: A | Discharge status: Home / Self Care | Payer: Managed Care, Other (non HMO) | Source: Ambulatory Visit | Attending: Internal Medicine | Admitting: Internal Medicine

## 2020-05-02 DIAGNOSIS — Z1231 Encounter for screening mammogram for malignant neoplasm of breast: Secondary | ICD-10-CM

## 2020-11-19 DIAGNOSIS — F411 Generalized anxiety disorder: Secondary | ICD-10-CM | POA: Insufficient documentation

## 2020-11-19 DIAGNOSIS — E559 Vitamin D deficiency, unspecified: Secondary | ICD-10-CM | POA: Insufficient documentation

## 2020-11-19 DIAGNOSIS — E782 Mixed hyperlipidemia: Secondary | ICD-10-CM | POA: Insufficient documentation

## 2020-11-19 DIAGNOSIS — I10 Essential (primary) hypertension: Secondary | ICD-10-CM | POA: Insufficient documentation

## 2020-11-19 DIAGNOSIS — R7303 Prediabetes: Secondary | ICD-10-CM | POA: Insufficient documentation

## 2020-11-20 DIAGNOSIS — L439 Lichen planus, unspecified: Secondary | ICD-10-CM | POA: Insufficient documentation

## 2021-09-07 IMAGING — MG MM DIGITAL SCREENING BILAT W/ TOMO AND CAD
8 series · 8 of 24 positions shown · non-contrast
Comparison: Previous exam(s).

CLINICAL DATA: Screening.

EXAM:
DIGITAL SCREENING BILATERAL MAMMOGRAM WITH TOMOSYNTHESIS AND CAD
TECHNIQUE: Bilateral screening digital craniocaudal and mediolateral oblique
mammograms were obtained. Bilateral screening digital breast
tomosynthesis was performed. The images were evaluated with
computer-aided detection.

[R MLO synth-2D]
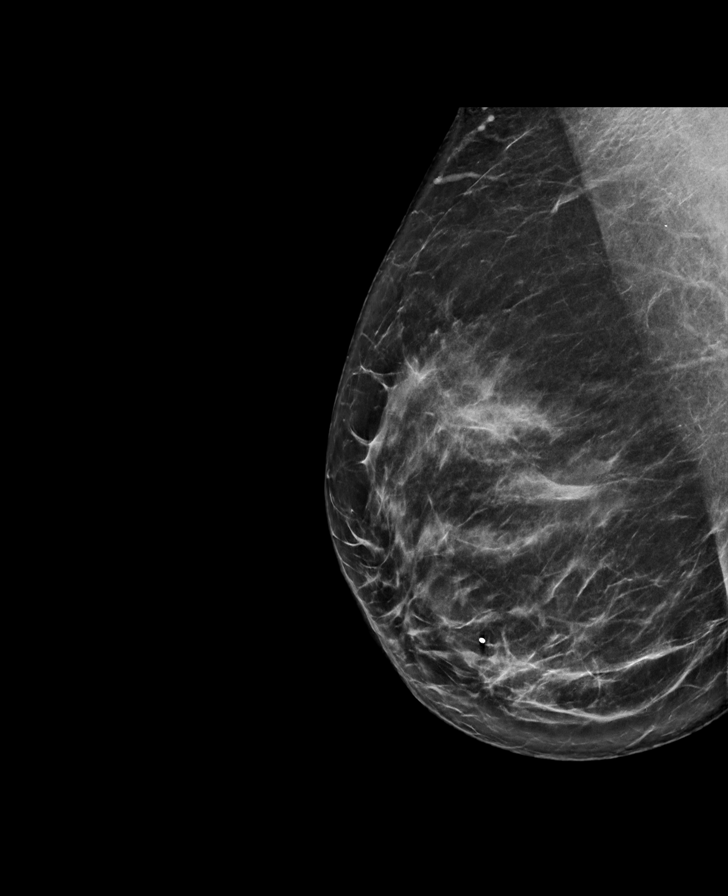

[L MLO synth-2D]
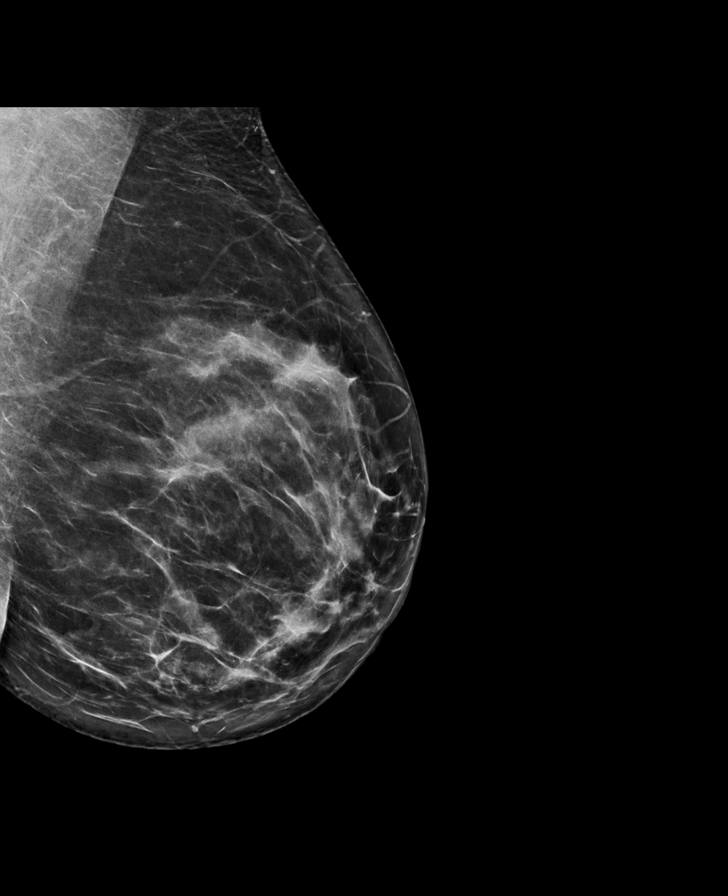

[L CC synth-2D]
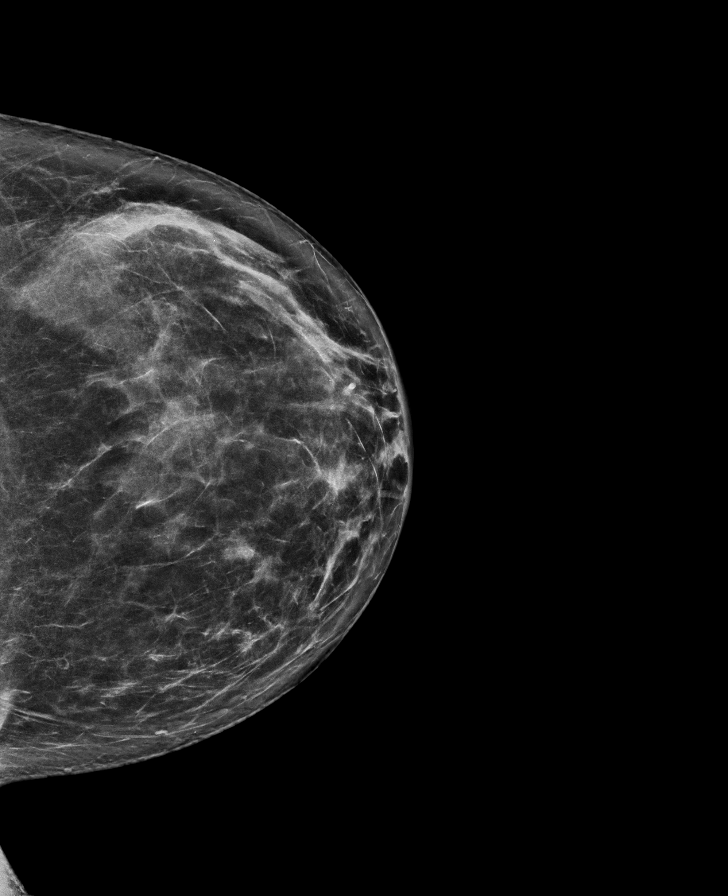

[R CC synth-2D]
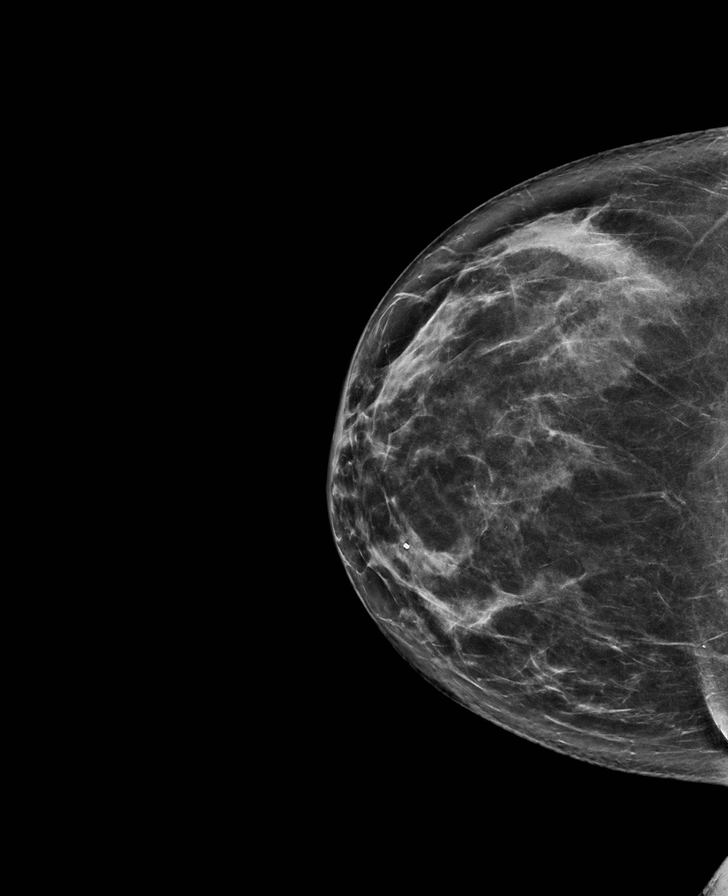

[L CC tomo · tomo slice 39/76.0]
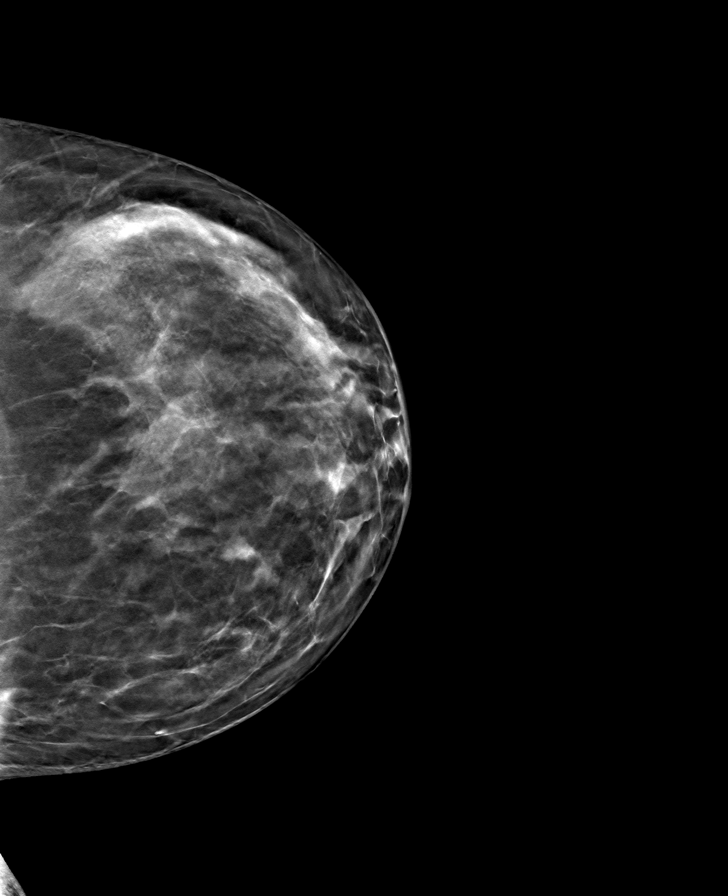

[R CC tomo · tomo slice 39/76.0]
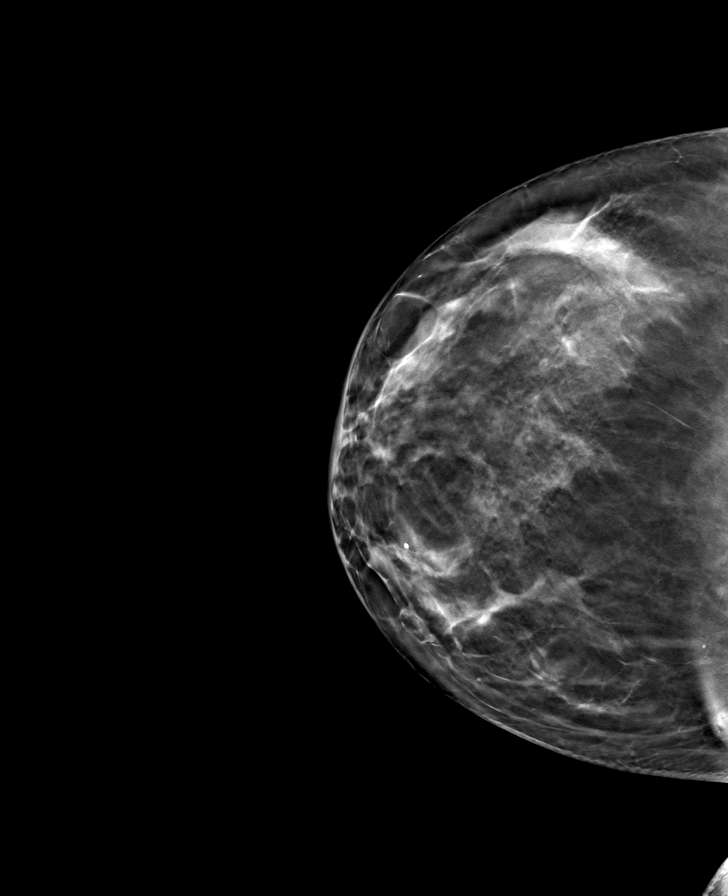

[L MLO tomo · tomo slice 43/86.0]
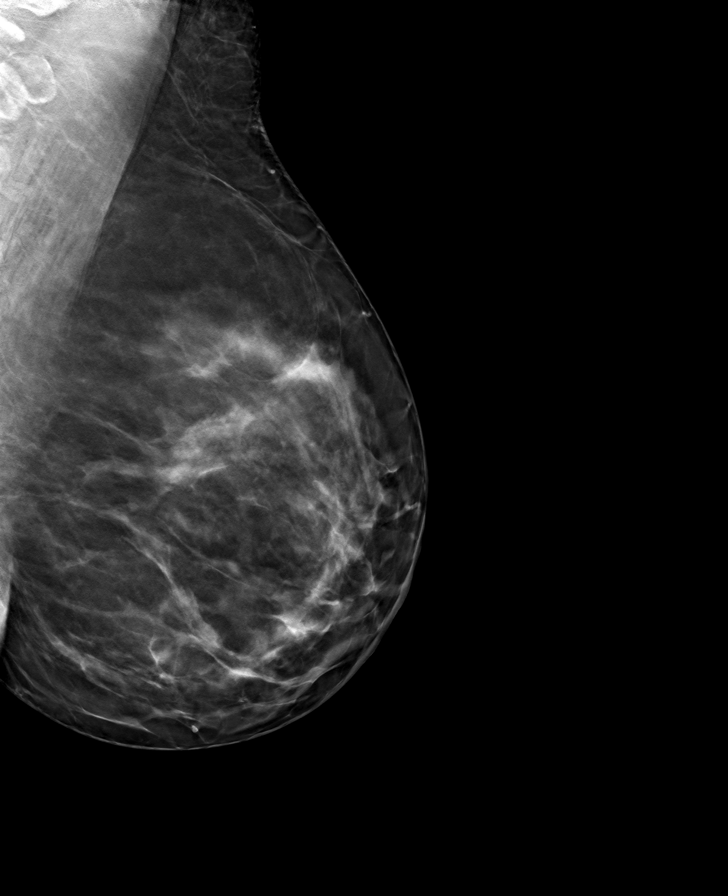

[R MLO tomo · tomo slice 42/83.0]
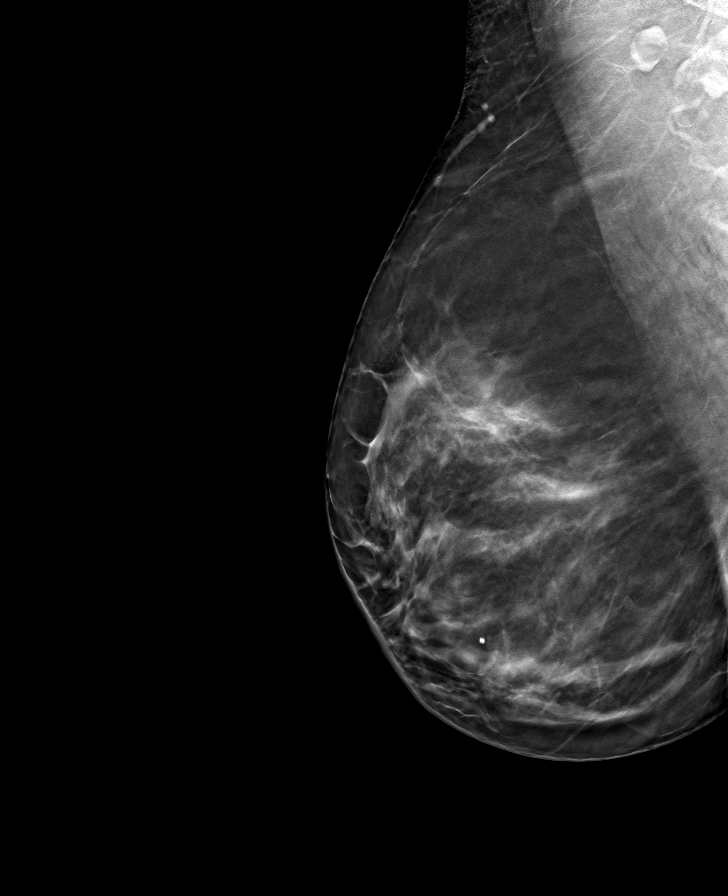

[8 of 24 positions shown; findings below may reference images not displayed]

ACR Breast Density Category c: The breast tissue is heterogeneously
dense, which may obscure small masses.
FINDINGS: There are no findings suspicious for malignancy. The images were
evaluated with computer-aided detection.
IMPRESSION: No mammographic evidence of malignancy. A result letter of this
screening mammogram will be mailed directly to the patient.

RECOMMENDATION:
Screening mammogram in one year. (Code:T4-5-GWO)

BI-RADS CATEGORY  1: Negative.

## 2021-10-07 ENCOUNTER — Other Ambulatory Visit: Payer: Self-pay | Admitting: Internal Medicine

## 2021-10-07 DIAGNOSIS — Z1231 Encounter for screening mammogram for malignant neoplasm of breast: Secondary | ICD-10-CM

## 2021-10-29 ENCOUNTER — Ambulatory Visit: Payer: Managed Care, Other (non HMO)

## 2021-12-25 DIAGNOSIS — K219 Gastro-esophageal reflux disease without esophagitis: Secondary | ICD-10-CM | POA: Insufficient documentation

## 2021-12-28 ENCOUNTER — Other Ambulatory Visit (HOSPITAL_COMMUNITY): Payer: Self-pay | Admitting: Family Medicine

## 2021-12-28 DIAGNOSIS — R791 Abnormal coagulation profile: Secondary | ICD-10-CM

## 2021-12-30 ENCOUNTER — Ambulatory Visit (HOSPITAL_COMMUNITY)
Admission: RE | Admit: 2021-12-30 | Discharge: 2021-12-30 | Disposition: A | Discharge status: Home / Self Care | Payer: Managed Care, Other (non HMO) | Source: Ambulatory Visit | Attending: Family Medicine | Admitting: Family Medicine

## 2021-12-30 DIAGNOSIS — R791 Abnormal coagulation profile: Secondary | ICD-10-CM | POA: Diagnosis present

## 2021-12-30 LAB — POCT I-STAT CREATININE: Creatinine, Ser: 0.8 mg/dL (ref 0.44–1.00)

## 2021-12-30 MED ORDER — IOHEXOL 350 MG/ML SOLN
75.0000 mL | Freq: Once | INTRAVENOUS | Status: AC | PRN
  Administered 2021-12-30: 75 mL via INTRAVENOUS

## 2021-12-31 ENCOUNTER — Encounter: Payer: Self-pay | Admitting: Gastroenterology

## 2022-01-14 ENCOUNTER — Ambulatory Visit: Payer: Managed Care, Other (non HMO)

## 2022-01-28 ENCOUNTER — Ambulatory Visit
Admission: RE | Admit: 2022-01-28 | Discharge: 2022-01-28 | Disposition: A | Discharge status: Home / Self Care | Payer: Managed Care, Other (non HMO) | Source: Ambulatory Visit | Attending: Internal Medicine | Admitting: Internal Medicine

## 2022-01-28 DIAGNOSIS — Z1231 Encounter for screening mammogram for malignant neoplasm of breast: Secondary | ICD-10-CM

## 2022-02-01 ENCOUNTER — Ambulatory Visit: Payer: Managed Care, Other (non HMO) | Admitting: Urology

## 2022-02-01 ENCOUNTER — Encounter: Payer: Self-pay | Admitting: Urology

## 2022-02-01 VITALS — BP 120/75 | HR 85

## 2022-02-01 DIAGNOSIS — R310 Gross hematuria: Secondary | ICD-10-CM | POA: Diagnosis not present

## 2022-02-01 LAB — URINALYSIS, ROUTINE W REFLEX MICROSCOPIC
Bilirubin, UA: NEGATIVE
Glucose, UA: NEGATIVE
Ketones, UA: NEGATIVE
Nitrite, UA: NEGATIVE
Protein,UA: NEGATIVE
Specific Gravity, UA: 1.025 (ref 1.005–1.030)
Urobilinogen, Ur: 0.2 mg/dL (ref 0.2–1.0)
pH, UA: 5 (ref 5.0–7.5)

## 2022-02-01 LAB — MICROSCOPIC EXAMINATION: RBC, Urine: >30 /hpf — AB (ref 0–2)

## 2022-02-01 NOTE — Progress Notes (Signed)
02/01/2022 1:53 PM   [name] 07-09-1960 [id number]  Referring provider: Valentino Nose, FNP [address] [place],   [address]  Gross hematuria   HPI: [name] is a 62yo here for evaluation of gross hematuria. 5 weeks ago she had gross hematuria with associated suprapubic pain and nausea. Hematuria lasted 1 week. No tobacco abuse history. No environmental exposures. No dysuria. No significant LUTS. Father has hx of nephrolithiasis. UA today shows >30rbcs/hpf.    PMH: Past Medical History:  Diagnosis Date   Anemia    Anxiety    Arthritis    Essential hypertension    GERD (gastroesophageal reflux disease)    Lumbago    Mixed hyperlipidemia     Surgical History: Past Surgical History:  Procedure Laterality Date   ABDOMINAL HYSTERECTOMY  1991-1992   [facility]   CARPAL TUNNEL RELEASE Right 10/26/2016   Procedure: RIGHT CARPAL TUNNEL RELEASE;  Surgeon: Daryll Brod, MD;  Location: [facility];  Service: Orthopedics;  Laterality: Right;   COLONOSCOPY N/A 07/13/2012   Procedure: COLONOSCOPY;  Surgeon: Daneil Dolin, MD;  [facility];  Service: Endoscopy;  Laterality: N/A;  2:30 PM-moved to [address] to notify pt   TRIGGER FINGER RELEASE Right 10/26/2016   Procedure: RELEASE TRIGGER FINGER/A-1 PULLEY RIGHT MIDDLE FINGER;  Surgeon: Daryll Brod, MD;  Location: [facility];  Service: Orthopedics;  Laterality: Right;   WISDOM TOOTH EXTRACTION      Home Medications:  Allergies as of 02/01/2022   No Known Allergies      Medication List        Accurate as of February 01, 2022  1:53 PM. If you have any questions, ask your nurse or doctor.          Biotin 1000 MCG tablet Take 1,000 mcg by mouth daily.   famotidine 20 MG tablet Commonly known as: PEPCID Take 1 tablet (20 mg total) by mouth 2 (two) times daily.   ferrous sulfate 325 (65 FE) MG tablet Take 325 mg by mouth daily with breakfast.   fish oil-omega-3 fatty  acids 1000 MG capsule Take 1,200 mg by mouth daily.   LYSINE PO Take by mouth.   magnesium 30 MG tablet Take 30 mg by mouth 2 (two) times daily.   metoprolol succinate 25 MG 24 hr tablet Commonly known as: TOPROL-XL Take 1 tablet by mouth daily.   multivitamin capsule Take 1 capsule by mouth daily.   multivitamin with minerals Tabs tablet Take 1 tablet by mouth daily.   naproxen sodium 220 MG tablet Commonly known as: ALEVE Take 220 mg by mouth daily as needed.   rosuvastatin 10 MG tablet Commonly known as: CRESTOR Take 10 mg by mouth daily.   Turmeric 450 MG Caps Take 1 capsule by mouth daily.   vitamin D3 50 MCG (2000 UT) Caps        Allergies: No Known Allergies  Family History: Family History  Problem Relation Age of Onset   Prostate cancer Father    Heart disease Father    Diabetes Maternal Grandmother    Diabetes Paternal Grandmother    Breast cancer Paternal Aunt    Colon cancer Neg Hx     Social History:  reports that she has never smoked. She has never used smokeless tobacco. She reports current alcohol use. She reports that she does not use drugs.  ROS: All other review of systems were reviewed and are negative except what  is noted above in HPI  Physical Exam: BP 120/75   Pulse 85   Constitutional:  Alert and oriented, No acute distress. HEENT: [place] AT, moist mucus membranes.  Trachea midline, no masses. Cardiovascular: No clubbing, cyanosis, or edema. Respiratory: Normal respiratory effort, no increased work of breathing. GI: Abdomen is soft, nontender, nondistended, no abdominal masses GU: No CVA tenderness.  Lymph: No cervical or inguinal lymphadenopathy. Skin: No rashes, bruises or suspicious lesions. Neurologic: Grossly intact, no focal deficits, moving all 4 extremities. Psychiatric: Normal mood and affect.  Laboratory Data: Lab Results  Component Value Date   WBC 4.3 08/24/2018   HGB 12.3 08/24/2018   HCT 37 08/24/2018   MCV  92.4 04/28/2016   PLT 263 08/24/2018    Lab Results  Component Value Date   CREATININE 0.80 12/30/2021    No results found for: "PSA"  No results found for: "TESTOSTERONE"  No results found for: "HGBA1C"  Urinalysis No results found for: "COLORURINE", "APPEARANCEUR", "LABSPEC", "PHURINE", "GLUCOSEU", "HGBUR", "BILIRUBINUR", "KETONESUR", "PROTEINUR", "UROBILINOGEN", "NITRITE", "LEUKOCYTESUR"  No results found for: "LABMICR", "WBCUA", "RBCUA", "LABEPIT", "MUCUS", "BACTERIA"  Pertinent Imaging:  No results found for this or any previous visit.  No results found for this or any previous visit.  No results found for this or any previous visit.  No results found for this or any previous visit.  No results found for this or any previous visit.  No valid procedures specified. No results found for this or any previous visit.  No results found for this or any previous visit.   Assessment & Plan:    1. Gross hematuria -Urine cytology -CT hematuria -office cystoscopy - Urinalysis, Routine w reflex microscopic   No follow-ups on file.  Wilkie Aye, MD  [facility] [place]

## 2022-02-01 NOTE — Patient Instructions (Signed)

## 2022-02-02 LAB — CYTOLOGY, URINE

## 2022-02-09 ENCOUNTER — Ambulatory Visit: Payer: Managed Care, Other (non HMO) | Admitting: Gastroenterology

## 2022-02-11 ENCOUNTER — Telehealth: Payer: Self-pay

## 2022-02-11 NOTE — Telephone Encounter (Signed)
[phone]  Amber from [employer] authorization called in today for order for patient CT. Patient wants to get CT from [place] in [place]. Made amber aware that CT are normal done at [facility] but I was willing to send Dr.McKenzie a message and someone will get back in touch after Dr. Alyson Ingles response. Call back # [phone]. Amber voiced understanding

## 2022-02-16 NOTE — Telephone Encounter (Signed)
Made patient aware that her urine cytology was normal. Patient voiced that she had blood in her urine and want to know if that is normal. Patient voiced that she has recently seen blood in her urine. Made patient aware that I will send a message to the MD and once he response someone will reach back out to her with the MD response. Patient voiced understanding

## 2022-02-16 NOTE — Telephone Encounter (Signed)
-----  Message from Cleon Gustin, MD sent at 02/16/2022  9:00 AM EST ----- Cytology normal ----- Message ----- From: Sherrilyn Rist, CMA Sent: 02/02/2022   3:47 PM EST To: Cleon Gustin, MD  Please review

## 2022-02-16 NOTE — Telephone Encounter (Signed)
Made patient aware that she need a CT done at [facility] Per Dr. Alyson Ingles. Patient voiced understanding

## 2022-03-04 ENCOUNTER — Ambulatory Visit (HOSPITAL_COMMUNITY)
Admission: RE | Admit: 2022-03-04 | Discharge: 2022-03-04 | Disposition: A | Discharge status: Home / Self Care | Payer: Managed Care, Other (non HMO) | Source: Ambulatory Visit | Attending: Urology | Admitting: Urology

## 2022-03-04 DIAGNOSIS — R310 Gross hematuria: Secondary | ICD-10-CM | POA: Insufficient documentation

## 2022-03-04 LAB — POCT I-STAT CREATININE: Creatinine, Ser: 0.8 mg/dL (ref 0.44–1.00)

## 2022-03-04 MED ORDER — IOHEXOL 300 MG/ML  SOLN
125.0000 mL | Freq: Once | INTRAMUSCULAR | Status: AC | PRN
  Administered 2022-03-04: 125 mL via INTRAVENOUS

## 2022-03-09 ENCOUNTER — Ambulatory Visit: Payer: Managed Care, Other (non HMO) | Admitting: Urology

## 2022-03-09 VITALS — BP 130/75 | HR 81

## 2022-03-09 DIAGNOSIS — R310 Gross hematuria: Secondary | ICD-10-CM | POA: Diagnosis not present

## 2022-03-09 LAB — URINALYSIS, ROUTINE W REFLEX MICROSCOPIC
Bilirubin, UA: NEGATIVE
Glucose, UA: NEGATIVE
Ketones, UA: NEGATIVE
Nitrite, UA: NEGATIVE
Protein,UA: NEGATIVE
Specific Gravity, UA: 1.025 (ref 1.005–1.030)
Urobilinogen, Ur: 0.2 mg/dL (ref 0.2–1.0)
pH, UA: 5.5 (ref 5.0–7.5)

## 2022-03-09 LAB — MICROSCOPIC EXAMINATION
Epithelial Cells (non renal): >10 /hpf — AB (ref 0–10)
RBC, Urine: >30 /hpf — AB (ref 0–2)

## 2022-03-09 MED ORDER — CIPROFLOXACIN HCL 500 MG PO TABS
500.0000 mg | ORAL_TABLET | Freq: Once | ORAL | Status: AC
  Administered 2022-03-09: 500 mg via ORAL

## 2022-03-09 NOTE — Progress Notes (Unsigned)
   03/09/22  CC: gross hematuria   HPI: [name] is a 62yo here for cystoscopy for gross hematuria Blood pressure 130/75, pulse 81. NED. A&Ox3.   No respiratory distress   Abd soft, NT, ND Normal external genitalia with patent urethral meatus  Cystoscopy Procedure Note  Patient identification was confirmed, informed consent was obtained, and patient was prepped using Betadine solution.  Lidocaine jelly was administered per urethral meatus.    Procedure: - Flexible cystoscope introduced, without any difficulty.   - Thorough search of the bladder revealed:    normal urethral meatus    normal urothelium    no stones    no ulcers     no tumors    no urethral polyps    no trabeculation  - Ureteral orifices were normal in position and appearance.  Post-Procedure: - Patient tolerated the procedure well  Assessment/ Plan: We discussed the management of recurrent gross hematuria with a negative hematuria workup. We discussed bilateral diagnostic ureteroscopy with possible biopsy and fulgeration and after discussing the procedure the patient wishes to proceed with surgery. Risks/benefits/alternatives discussed   No follow-ups on file.  Nicolette Bang, MD

## 2022-03-10 ENCOUNTER — Telehealth: Payer: Self-pay

## 2022-03-10 NOTE — Telephone Encounter (Signed)
I called [name] to discuss possible surgery dates per Dr. Alyson Ingles.  Patient reports she is going to see a nephrologist for a 2nd opinion and will reach back out to our office once she has been to see nephrologist.  Surgical sheet scanned into media

## 2022-03-11 ENCOUNTER — Encounter: Payer: Self-pay | Admitting: Urology

## 2022-03-11 NOTE — Patient Instructions (Signed)

## 2022-05-10 ENCOUNTER — Telehealth: Payer: Self-pay

## 2022-05-10 NOTE — Telephone Encounter (Signed)
Patient called advising that she seen the Nephrologist and wished to proceed with scheduling the surgery.

## 2022-05-11 ENCOUNTER — Emergency Department (HOSPITAL_COMMUNITY)
Admission: EM | Admit: 2022-05-11 | Discharge: 2022-05-11 | Disposition: A | Discharge status: Home / Self Care | Payer: Managed Care, Other (non HMO) | Attending: Emergency Medicine | Admitting: Emergency Medicine

## 2022-05-11 ENCOUNTER — Emergency Department (HOSPITAL_COMMUNITY): Payer: Managed Care, Other (non HMO)

## 2022-05-11 ENCOUNTER — Encounter (HOSPITAL_COMMUNITY): Payer: Self-pay

## 2022-05-11 DIAGNOSIS — N132 Hydronephrosis with renal and ureteral calculous obstruction: Secondary | ICD-10-CM | POA: Insufficient documentation

## 2022-05-11 DIAGNOSIS — R109 Unspecified abdominal pain: Secondary | ICD-10-CM | POA: Diagnosis present

## 2022-05-11 DIAGNOSIS — Z79899 Other long term (current) drug therapy: Secondary | ICD-10-CM | POA: Insufficient documentation

## 2022-05-11 DIAGNOSIS — N200 Calculus of kidney: Secondary | ICD-10-CM

## 2022-05-11 LAB — URINALYSIS, ROUTINE W REFLEX MICROSCOPIC
Bacteria, UA: NONE SEEN
Bilirubin Urine: NEGATIVE
Glucose, UA: NEGATIVE mg/dL
Ketones, ur: 20 mg/dL — AB
Leukocytes,Ua: NEGATIVE
Nitrite: NEGATIVE
Protein, ur: NEGATIVE mg/dL
RBC / HPF: >50 RBC/hpf (ref 0–5)
Specific Gravity, Urine: 1.026 (ref 1.005–1.030)
pH: 5 (ref 5.0–8.0)

## 2022-05-11 LAB — CBC WITH DIFFERENTIAL/PLATELET
Abs Immature Granulocytes: 0.03 10*3/uL (ref 0.00–0.07)
Basophils Absolute: 0 10*3/uL (ref 0.0–0.1)
Basophils Relative: 0 %
Eosinophils Absolute: 0 10*3/uL (ref 0.0–0.5)
Eosinophils Relative: 0 %
HCT: 37.9 % (ref 36.0–46.0)
Hemoglobin: 12.4 g/dL (ref 12.0–15.0)
Immature Granulocytes: 0 %
Lymphocytes Relative: 6 %
Lymphs Abs: 0.6 10*3/uL — ABNORMAL LOW (ref 0.7–4.0)
MCH: 30.8 pg (ref 26.0–34.0)
MCHC: 32.7 g/dL (ref 30.0–36.0)
MCV: 94.3 fL (ref 80.0–100.0)
Monocytes Absolute: 0.2 10*3/uL (ref 0.1–1.0)
Monocytes Relative: 2 %
Neutro Abs: 8.4 10*3/uL — ABNORMAL HIGH (ref 1.7–7.7)
Neutrophils Relative %: 92 %
Platelets: 243 10*3/uL (ref 150–400)
RBC: 4.02 MIL/uL (ref 3.87–5.11)
RDW: 12.7 % (ref 11.5–15.5)
WBC: 9.3 10*3/uL (ref 4.0–10.5)
nRBC: 0 % (ref 0.0–0.2)

## 2022-05-11 LAB — COMPREHENSIVE METABOLIC PANEL
ALT: 21 U/L (ref 0–44)
AST: 25 U/L (ref 15–41)
Albumin: 4.4 g/dL (ref 3.5–5.0)
Alkaline Phosphatase: 74 U/L (ref 38–126)
Anion gap: 5 (ref 5–15)
BUN: 29 mg/dL — ABNORMAL HIGH (ref 8–23)
CO2: 22 mmol/L (ref 22–32)
Calcium: 8.5 mg/dL — ABNORMAL LOW (ref 8.9–10.3)
Chloride: 106 mmol/L (ref 98–111)
Creatinine, Ser: 1.14 mg/dL — ABNORMAL HIGH (ref 0.44–1.00)
GFR, Estimated: 55 mL/min — ABNORMAL LOW (ref >60)
Glucose, Bld: 168 mg/dL — ABNORMAL HIGH (ref 70–99)
Potassium: 3.9 mmol/L (ref 3.5–5.1)
Sodium: 133 mmol/L — ABNORMAL LOW (ref 135–145)
Total Bilirubin: 0.4 mg/dL (ref 0.3–1.2)
Total Protein: 7.5 g/dL (ref 6.5–8.1)

## 2022-05-11 MED ORDER — SODIUM CHLORIDE 0.9 % IV BOLUS
1000.0000 mL | Freq: Once | INTRAVENOUS | Status: AC
  Administered 2022-05-11: 1000 mL via INTRAVENOUS

## 2022-05-11 MED ORDER — KETOROLAC TROMETHAMINE 30 MG/ML IJ SOLN
15.0000 mg | Freq: Once | INTRAMUSCULAR | Status: AC
  Administered 2022-05-11: 15 mg via INTRAVENOUS
  Filled 2022-05-11: qty 1

## 2022-05-11 MED ORDER — IBUPROFEN 400 MG PO TABS: 400.0000 mg | ORAL_TABLET | Freq: Three times a day (TID) | ORAL | 0 refills | Status: DC

## 2022-05-11 MED ORDER — ONDANSETRON 4 MG PO TBDP: 4.0000 mg | ORAL_TABLET | Freq: Three times a day (TID) | ORAL | 0 refills | Status: DC | PRN

## 2022-05-11 MED ORDER — TAMSULOSIN HCL 0.4 MG PO CAPS: 0.4000 mg | ORAL_CAPSULE | Freq: Every day | ORAL | 0 refills | Status: DC

## 2022-05-11 MED ORDER — ONDANSETRON HCL 4 MG/2ML IJ SOLN
4.0000 mg | Freq: Once | INTRAMUSCULAR | Status: AC
  Administered 2022-05-11: 4 mg via INTRAVENOUS
  Filled 2022-05-11: qty 2

## 2022-05-11 NOTE — Discharge Instructions (Signed)
Please be sure to follow-up with your urologist.  Return here for concerning changes in your condition.

## 2022-05-11 NOTE — ED Provider Notes (Signed)
EMERGENCY DEPARTMENT AT [facility] Provider Note   CSN: [id number] Arrival date & time: 05/11/22  1191     History  Chief Complaint  Patient presents with   Flank Pain    [name] is a 62 y.o. female.  HPI Patient presents with left flank pain.  She notes that she has been dealing with pain intermittently for months, has been seen, evaluated by nephrology, urology has had cystoscopy, CT scan, but continues to have episodes.  Over the past 24 hours she has had more severe pain than usual, left-sided, nonradiating with associated nausea, vomiting. No relief with anything.    Home Medications Prior to Admission medications   Medication Sig Start Date End Date Taking? Authorizing Provider  ibuprofen (ADVIL) 400 MG tablet Take 1 tablet (400 mg total) by mouth 3 (three) times daily for 3 days. Take one tablet three times daily for three days 05/11/22 05/14/22 Yes Gerhard Munch, MD  ondansetron (ZOFRAN-ODT) 4 MG disintegrating tablet Take 1 tablet (4 mg total) by mouth every 8 (eight) hours as needed for nausea or vomiting. 05/11/22  Yes Gerhard Munch, MD  Biotin 1000 MCG tablet Take 1,000 mcg by mouth daily.     [provider]  Cholecalciferol (VITAMIN D3) 50 MCG (2000 UT) CAPS     [provider]  famotidine (PEPCID) 20 MG tablet Take 1 tablet (20 mg total) by mouth 2 (two) times daily. 04/28/16   Samuel Jester, DO  ferrous sulfate 325 (65 FE) MG tablet Take 325 mg by mouth daily with breakfast.    [provider]  fish oil-omega-3 fatty acids 1000 MG capsule Take 1,200 mg by mouth daily.    [provider]  LYSINE PO Take by mouth.    [provider]  magnesium 30 MG tablet Take 30 mg by mouth 2 (two) times daily.    [provider]  metoprolol succinate (TOPROL-XL) 25 MG 24 hr tablet Take 1 tablet by mouth daily. 08/16/16   [provider]  Multiple Vitamin (MULTIVITAMIN WITH MINERALS)  TABS tablet Take 1 tablet by mouth daily.    [provider]  Multiple Vitamin (MULTIVITAMIN) capsule Take 1 capsule by mouth daily.    [provider]  naproxen sodium (ALEVE) 220 MG tablet Take 220 mg by mouth daily as needed.    [provider]  rosuvastatin (CRESTOR) 10 MG tablet Take 10 mg by mouth daily.    [provider]  Turmeric 450 MG CAPS Take 1 capsule by mouth daily.    [provider]      Allergies    Patient has no known allergies.    Review of Systems   Review of Systems  All other systems reviewed and are negative.   Physical Exam Updated Vital Signs BP (!) 145/74   Pulse 60   Temp 98.2 F (36.8 C)   Resp 18   Ht  (1.626 m)   Wt 83 kg   SpO2 97%   BMI 31.41 kg/m  Physical Exam Vitals and nursing note reviewed.  Constitutional:      General: She is not in acute distress.    Appearance: She is well-developed.     Comments: Uncomfortable appearing adult female awake and alert  HENT:     Head: Normocephalic and atraumatic.  Eyes:     Conjunctiva/sclera: Conjunctivae normal.  Cardiovascular:     Rate and Rhythm: Normal rate and regular rhythm.  Pulmonary:  Effort: Pulmonary effort is normal. No respiratory distress.     Breath sounds: Normal breath sounds. No stridor.  Abdominal:     General: There is no distension.     Tenderness: There is left CVA tenderness.  Skin:    General: Skin is warm and dry.  Neurological:     Mental Status: She is alert and oriented to person, place, and time.     Cranial Nerves: No cranial nerve deficit.  Psychiatric:        Mood and Affect: Mood normal.     ED Results / Procedures / Treatments   Labs (all labs ordered are listed, but only abnormal results are displayed) Labs Reviewed  COMPREHENSIVE METABOLIC PANEL - Abnormal; Notable for the following components:      Result Value   Sodium 133 (*)    Glucose, Bld 168 (*)    BUN 29 (*)    Creatinine, Ser 1.14  (*)    Calcium 8.5 (*)    GFR, Estimated 55 (*)    All other components within normal limits  CBC WITH DIFFERENTIAL/PLATELET - Abnormal; Notable for the following components:   Neutro Abs 8.4 (*)    Lymphs Abs 0.6 (*)    All other components within normal limits  URINALYSIS, ROUTINE W REFLEX MICROSCOPIC - Abnormal; Notable for the following components:   APPearance CLOUDY (*)    Hgb urine dipstick MODERATE (*)    Ketones, ur 20 (*)    All other components within normal limits    EKG None  Radiology CT Renal Stone Study  Result Date: 05/11/2022 CLINICAL DATA:  Left flank pain and hematuria. EXAM: CT ABDOMEN AND PELVIS WITHOUT CONTRAST TECHNIQUE: Multidetector CT imaging of the abdomen and pelvis was performed following the standard protocol without IV contrast. RADIATION DOSE REDUCTION: This exam was performed according to the departmental dose-optimization program which includes automated exposure control, adjustment of the mA and/or kV according to patient size and/or use of iterative reconstruction technique. COMPARISON:  CT abdomen/pelvis 03/04/2022. FINDINGS: Lower chest: Sub 6 mm nodules in the left base are unchanged. See recommendation on prior CT chest from 12/30/2021. The lung bases are otherwise clear. The imaged heart is unremarkable. Hepatobiliary: The liver and gallbladder are unremarkable. There is no biliary ductal dilatation. Pancreas: Unremarkable. Spleen: Unremarkable. Adrenals/Urinary Tract: The adrenals are unremarkable. There is a 6 mm stone in the distal left ureter just proximal to the UVJ with mild upstream hydroureteronephrosis, increased since the prior study. The stone has migrated distally since the study from 03/04/2022. There are no stones in the right kidney or along the course of the right ureter. There is no parenchymal lesions, within the confines of noncontrast technique. The bladder is decompressed but grossly unremarkable. Stomach/Bowel: The stomach is  unremarkable. There is no evidence of bowel obstruction. There is no abnormal bowel wall thickening or inflammatory change. The appendix is normal. Vascular/Lymphatic: The abdominal aorta is normal in course and caliber. There is no abdominopelvic lymphadenopathy. Reproductive: The uterus and adnexa are unremarkable. Other: There is no ascites or free air. Musculoskeletal: There is no acute osseous abnormality or suspicious osseous lesion. IMPRESSION: 6 mm stone in the distal left ureter just proximal to the UVJ with mild upstream hydroureteronephrosis, increased since the prior study with interval distal migration of the stone since 03/04/2022. Electronically Signed   By: Lesia Hausen M.D.   On: 05/11/2022 10:19    Procedures Procedures    Medications Ordered in ED Medications  sodium  chloride 0.9 % bolus 1,000 mL (0 mLs Intravenous Stopped 05/11/22 1144)  ondansetron (ZOFRAN) injection 4 mg (4 mg Intravenous Given 05/11/22 0923)  ketorolac (TORADOL) 30 MG/ML injection 15 mg (15 mg Intravenous Given 05/11/22 1610)    ED Course/ Medical Decision Making/ A&P                             Medical Decision Making Adult female with history of episodic flank pain, hematuria presents with dysuria, flank pain.  Differential includes colic versus stone versus infection, less likely intra-abdominal mass given review of her CT imaging from earlier this year. Patient received IV fluids, analgesics, CT scan, urinalysis ordered.   Amount and/or Complexity of Data Reviewed External Data Reviewed: notes.    Details: CT scan from earlier this year included below Labs: ordered. Decision-making details documented in ED Course. Radiology: ordered and independent interpretation performed. Decision-making details documented in ED Course.  Risk Prescription drug management. Decision regarding hospitalization.  IMPRESSION: 1.  No acute process or explanation for hematuria. 2. Subcentimeter focus of late  arterial phase right hepatic lobe hyperenhancement is most likely a perfusion anomaly. If no history of primary malignancy or liver disease, no imaging follow-up is felt indicated. If any such history, consider further evaluation with pre and post contrast abdominal MRI. 3. Hepatomegaly 4.  Aortic Atherosclerosis (ICD10-I70.0). 11:45 AM Patient markedly.  Labs reviewed, discussed, urinalysis without evidence for infection, CT with evidence for 6 mm stone, left-sided with mild hydro-, but she is improved markedly with Toradol, as above.  Patient has mild dehydration, has received fluids here.  Patient has already placed a call to her urologist to expedite follow-up, without evidence for obstruction, bacteremia, sepsis, the patient discharged in stable condition.        Final Clinical Impression(s) / ED Diagnoses Final diagnoses:  Nephrolithiasis    Rx / DC Orders ED Discharge Orders          Ordered    ondansetron (ZOFRAN-ODT) 4 MG disintegrating tablet  Every 8 hours PRN        05/11/22 1145    ibuprofen (ADVIL) 400 MG tablet  3 times daily        05/11/22 1145              Gerhard Munch, MD 05/11/22 1145

## 2022-05-11 NOTE — ED Triage Notes (Signed)
Patient c/o left flank pain, nausea and vomiting since yesterday. Pt has hx of same.

## 2022-05-13 ENCOUNTER — Telehealth: Payer: Self-pay

## 2022-05-13 ENCOUNTER — Other Ambulatory Visit: Payer: Self-pay | Admitting: Urology

## 2022-05-13 MED ORDER — OXYCODONE-ACETAMINOPHEN 5-325 MG PO TABS: 1.0000 | ORAL_TABLET | ORAL | 0 refills | Status: DC | PRN

## 2022-05-13 NOTE — Telephone Encounter (Signed)
Patient states she went to the ER for pain and they did a CT that showed she has a kidney stone.  She is taking Ibuprofen and is not helping her pain.  Can you send in pain meds for her?  She is also asking for an update on surgery? No follow up scheduled.

## 2022-05-14 ENCOUNTER — Other Ambulatory Visit: Payer: Self-pay

## 2022-05-14 MED ORDER — IBUPROFEN 400 MG PO TABS: 400.0000 mg | ORAL_TABLET | ORAL | 0 refills | Status: AC | PRN

## 2022-05-14 NOTE — Telephone Encounter (Signed)
I spoke with [name]. We have discussed possible surgery dates and 05/27/2022 was agreed upon by all parties. Patient given information about surgery date, what to expect pre-operatively and post operatively.    We discussed that a pre-op nurse will be calling to set up the pre-op visit that will take place prior to surgery. Informed patient that our office will communicate any additional care to be provided after surgery.    Patients questions or concerns were discussed during our call. Advised to call our office should there be any additional information, questions or concerns that arise. Patient verbalized understanding.

## 2022-05-17 NOTE — Telephone Encounter (Signed)
See other task

## 2022-05-24 ENCOUNTER — Telehealth: Payer: Self-pay

## 2022-05-24 NOTE — Telephone Encounter (Signed)
Patient will need a work note for her surgery-she is asking what her estimated time out of work would be?  Sx date 05/02.

## 2022-05-25 ENCOUNTER — Encounter (HOSPITAL_COMMUNITY)
Admission: RE | Admit: 2022-05-25 | Discharge: 2022-05-25 | Disposition: A | Discharge status: Home / Self Care | Payer: Managed Care, Other (non HMO) | Source: Ambulatory Visit | Attending: Urology | Admitting: Urology

## 2022-05-25 ENCOUNTER — Other Ambulatory Visit: Payer: Self-pay

## 2022-05-25 ENCOUNTER — Encounter (HOSPITAL_COMMUNITY): Payer: Self-pay

## 2022-05-25 NOTE — Progress Notes (Signed)
PAT phone call completed. Pt verbalized understanding on procedure instructions and arrival time

## 2022-05-25 NOTE — Telephone Encounter (Signed)
Patient is aware of MD response.  Work note created and a signed copy will be left at front desk for patient to pick up.  She also requested a copy be sent to her Mychart.  Copy shared via Mychart.

## 2022-05-27 ENCOUNTER — Ambulatory Visit (HOSPITAL_COMMUNITY)
Admission: RE | Admit: 2022-05-27 | Discharge: 2022-05-27 | Disposition: A | Discharge status: Home / Self Care | Payer: Managed Care, Other (non HMO) | Attending: Urology | Admitting: Urology

## 2022-05-27 ENCOUNTER — Encounter (HOSPITAL_COMMUNITY): Payer: Self-pay | Admitting: Urology

## 2022-05-27 ENCOUNTER — Ambulatory Visit (HOSPITAL_BASED_OUTPATIENT_CLINIC_OR_DEPARTMENT_OTHER): Payer: Managed Care, Other (non HMO) | Admitting: Anesthesiology

## 2022-05-27 ENCOUNTER — Telehealth: Payer: Self-pay

## 2022-05-27 ENCOUNTER — Other Ambulatory Visit: Payer: Self-pay

## 2022-05-27 ENCOUNTER — Ambulatory Visit (HOSPITAL_COMMUNITY): Payer: Managed Care, Other (non HMO) | Admitting: Anesthesiology

## 2022-05-27 ENCOUNTER — Encounter (HOSPITAL_COMMUNITY)
Admission: RE | Disposition: A | Discharge status: Home / Self Care | Payer: Self-pay | Source: Home / Self Care | Attending: Urology

## 2022-05-27 ENCOUNTER — Ambulatory Visit (HOSPITAL_COMMUNITY): Payer: Managed Care, Other (non HMO)

## 2022-05-27 DIAGNOSIS — K219 Gastro-esophageal reflux disease without esophagitis: Secondary | ICD-10-CM | POA: Insufficient documentation

## 2022-05-27 DIAGNOSIS — D649 Anemia, unspecified: Secondary | ICD-10-CM

## 2022-05-27 DIAGNOSIS — F419 Anxiety disorder, unspecified: Secondary | ICD-10-CM | POA: Diagnosis not present

## 2022-05-27 DIAGNOSIS — N3289 Other specified disorders of bladder: Secondary | ICD-10-CM

## 2022-05-27 DIAGNOSIS — R310 Gross hematuria: Secondary | ICD-10-CM | POA: Insufficient documentation

## 2022-05-27 DIAGNOSIS — I10 Essential (primary) hypertension: Secondary | ICD-10-CM | POA: Insufficient documentation

## 2022-05-27 HISTORY — PX: CYSTOSCOPY WITH RETROGRADE PYELOGRAM, URETEROSCOPY AND STENT PLACEMENT: SHX5789

## 2022-05-27 SURGERY — CYSTOURETEROSCOPY, WITH RETROGRADE PYELOGRAM AND STENT INSERTION
Anesthesia: General | Site: Ureter | Laterality: Bilateral

## 2022-05-27 MED ORDER — CHLORHEXIDINE GLUCONATE 0.12 % MT SOLN: 15.0000 mL | Freq: Once | OROMUCOSAL | Status: DC

## 2022-05-27 MED ORDER — CHLORHEXIDINE GLUCONATE 0.12 % MT SOLN
OROMUCOSAL | Status: AC
  Filled 2022-05-27: qty 15

## 2022-05-27 MED ORDER — PROPOFOL 10 MG/ML IV BOLUS
INTRAVENOUS | Status: AC
  Filled 2022-05-27: qty 20

## 2022-05-27 MED ORDER — ONDANSETRON HCL 4 MG PO TABS: 4.0000 mg | ORAL_TABLET | Freq: Every day | ORAL | 1 refills | Status: DC | PRN

## 2022-05-27 MED ORDER — OXYCODONE-ACETAMINOPHEN 5-325 MG PO TABS: 1.0000 | ORAL_TABLET | ORAL | 0 refills | Status: DC | PRN

## 2022-05-27 MED ORDER — DIATRIZOATE MEGLUMINE 30 % UR SOLN
URETHRAL | Status: DC | PRN
  Administered 2022-05-27: 7 mL via URETHRAL

## 2022-05-27 MED ORDER — ORAL CARE MOUTH RINSE: 15.0000 mL | Freq: Once | OROMUCOSAL | Status: DC

## 2022-05-27 MED ORDER — CEFAZOLIN SODIUM-DEXTROSE 2-4 GM/100ML-% IV SOLN
INTRAVENOUS | Status: AC
  Filled 2022-05-27: qty 100

## 2022-05-27 MED ORDER — FENTANYL CITRATE PF 50 MCG/ML IJ SOSY: 25.0000 ug | PREFILLED_SYRINGE | INTRAMUSCULAR | Status: DC | PRN

## 2022-05-27 MED ORDER — ONDANSETRON HCL 4 MG/2ML IJ SOLN: 4.0000 mg | Freq: Once | INTRAMUSCULAR | Status: DC | PRN

## 2022-05-27 MED ORDER — OXYCODONE HCL 5 MG/5ML PO SOLN: 5.0000 mg | Freq: Once | ORAL | Status: DC | PRN

## 2022-05-27 MED ORDER — FENTANYL CITRATE (PF) 100 MCG/2ML IJ SOLN
INTRAMUSCULAR | Status: DC | PRN
  Administered 2022-05-27: 50 ug via INTRAVENOUS

## 2022-05-27 MED ORDER — FENTANYL CITRATE (PF) 100 MCG/2ML IJ SOLN
INTRAMUSCULAR | Status: AC
  Filled 2022-05-27: qty 2

## 2022-05-27 MED ORDER — CEFAZOLIN SODIUM-DEXTROSE 2-4 GM/100ML-% IV SOLN
2.0000 g | INTRAVENOUS | Status: AC
  Administered 2022-05-27: 2 g via INTRAVENOUS

## 2022-05-27 MED ORDER — ONDANSETRON HCL 4 MG/2ML IJ SOLN
INTRAMUSCULAR | Status: AC
  Filled 2022-05-27: qty 2

## 2022-05-27 MED ORDER — ONDANSETRON HCL 4 MG/2ML IJ SOLN
INTRAMUSCULAR | Status: DC | PRN
  Administered 2022-05-27: 4 mg via INTRAVENOUS

## 2022-05-27 MED ORDER — WATER FOR IRRIGATION, STERILE IR SOLN
Status: DC | PRN
  Administered 2022-05-27: 1000 mL

## 2022-05-27 MED ORDER — LIDOCAINE HCL (CARDIAC) PF 100 MG/5ML IV SOSY
PREFILLED_SYRINGE | INTRAVENOUS | Status: DC | PRN
  Administered 2022-05-27: 60 mg via INTRAVENOUS

## 2022-05-27 MED ORDER — DIATRIZOATE MEGLUMINE 30 % UR SOLN
URETHRAL | Status: AC
  Filled 2022-05-27: qty 100

## 2022-05-27 MED ORDER — TAMSULOSIN HCL 0.4 MG PO CAPS: 0.4000 mg | ORAL_CAPSULE | Freq: Every day | ORAL | 0 refills | Status: DC

## 2022-05-27 MED ORDER — OXYCODONE HCL 5 MG PO TABS: 5.0000 mg | ORAL_TABLET | Freq: Once | ORAL | Status: DC | PRN

## 2022-05-27 MED ORDER — LACTATED RINGERS IV SOLN: INTRAVENOUS | Status: DC

## 2022-05-27 MED ORDER — EPHEDRINE 5 MG/ML INJ
INTRAVENOUS | Status: AC
  Filled 2022-05-27: qty 5

## 2022-05-27 MED ORDER — PROPOFOL 10 MG/ML IV BOLUS
INTRAVENOUS | Status: DC | PRN
  Administered 2022-05-27: 160 mg via INTRAVENOUS

## 2022-05-27 MED ORDER — SODIUM CHLORIDE 0.9 % IR SOLN
Status: DC | PRN
  Administered 2022-05-27 (×2): 3000 mL

## 2022-05-27 SURGICAL SUPPLY — 27 items
BAG DRAIN URO TABLE W/ADPT NS (BAG) ×3 IMPLANT
BAG DRN 8 ADPR NS SKTRN CSTL (BAG) ×2
BAG HAMPER (MISCELLANEOUS) ×3 IMPLANT
CATH INTERMIT  6FR 70CM (CATHETERS) ×3 IMPLANT
CLOTH BEACON ORANGE TIMEOUT ST (SAFETY) ×3 IMPLANT
DECANTER SPIKE VIAL GLASS SM (MISCELLANEOUS) ×3 IMPLANT
EXTRACTOR STONE NITINOL NGAGE (UROLOGICAL SUPPLIES) IMPLANT
GLOVE BIO SURGEON STRL SZ8 (GLOVE) ×3 IMPLANT
GLOVE BIOGEL PI IND STRL 7.0 (GLOVE) ×6 IMPLANT
GOWN STRL REUS W/TWL LRG LVL3 (GOWN DISPOSABLE) ×3 IMPLANT
GOWN STRL REUS W/TWL XL LVL3 (GOWN DISPOSABLE) ×3 IMPLANT
GUIDEWIRE STR DUAL SENSOR (WIRE) ×3 IMPLANT
GUIDEWIRE STR ZIPWIRE 035X150 (MISCELLANEOUS) ×3 IMPLANT
IV NS IRRIG 3000ML ARTHROMATIC (IV SOLUTION) ×6 IMPLANT
KIT TURNOVER CYSTO (KITS) ×3 IMPLANT
MANIFOLD NEPTUNE II (INSTRUMENTS) ×3 IMPLANT
PACK CYSTO (CUSTOM PROCEDURE TRAY) ×3 IMPLANT
PAD ARMBOARD 7.5X6 YLW CONV (MISCELLANEOUS) ×3 IMPLANT
SHEATH URETERAL 12FRX35CM (MISCELLANEOUS) IMPLANT
STENT URET 6FRX26 CONTOUR (STENTS) ×2 IMPLANT
SYR 10ML LL (SYRINGE) ×3 IMPLANT
SYR CONTROL 10ML LL (SYRINGE) ×3 IMPLANT
TOWEL NATURAL 4PK STERILE (DISPOSABLE) ×3 IMPLANT
TOWEL OR 17X26 4PK STRL BLUE (TOWEL DISPOSABLE) ×3 IMPLANT
TRACTIP FLEXIVA PULS ID 200XHI (Laser) IMPLANT
TRACTIP FLEXIVA PULSE ID 200 (Laser)
WATER STERILE IRR 500ML POUR (IV SOLUTION) ×3 IMPLANT

## 2022-05-27 NOTE — Op Note (Signed)
Preoperative diagnosis: gross hematuria  Postoperative diagnosis: Same  Procedure: 1 cystoscopy 2. bilateral retrograde pyelography 3.  Intraoperative fluoroscopy, under one hour, with interpretation 4.  bilateral diagnostic ureteroscopy 5.  bilateral 6x26 JJ ureteral stent placement  Attending: Wilkie Aye  Anesthesia: General  Estimated blood loss: Minimal  Drains: bilateral 6x26 JJ ureteral stents  Specimens: none  Antibiotics: ancef  Findings: Edema of the left distal ureter consistent with a recently passed stone. Ureteral orifices in normal anatomic location. No hydronephrosis or filling defects in either collecting system  Indications: Patient is a 62 year old female with a history of persistent gross hematuria and a negative hematuria workup.  After discussing treatment options, they decided proceed with bilateral diagnostic ureteroscopy.  Procedure in detail: The patient was brought to the operating room and a brief timeout was done to ensure correct patient, correct procedure, correct site.  General anesthesia was administered patient was placed in dorsal lithotomy position.  Their genitalia was then prepped and draped in usual sterile fashion.  A rigid 22 French cystoscope was passed in the urethra and the bladder.  Bladder was inspected and we noted no suspicious lesions.  the ureteral orifices were in the normal orthotopic locations.  a 6 french ureteral catheter was then instilled into the left ureteral orifice and  a gentle retrograde was obtained and findings noted above. We then advanced a zipwire through the ureteral catheter and up to the renal pelvis. We removed the ureteral catheter and then cannulated the left ureter with a semirigid ureteroscope. We performed ureteroscopy to the UPJ and noted no tumors or stones. We then elected to place a stent over the original zipwire. We advanced a 6x26 JJ ureteral stent up to the renal pelvis. The wire was removed and good  coil was noted in the renal pelvis under fluoroscopy and the bladder under direct vision. We then turned our attention to the right side. a 6 french ureteral catheter was then instilled into the right ureteral orifice and  a gentle retrograde was obtained and findings noted above. We then advanced a zipwire through the ureteral catheter and up to the renal pelvis. We removed the ureteral catheter and then cannulated the right ureter with a semirigid ureteroscope. We performed ureteroscopy to the UPJ and noted no tumors or stones. We then elected to place a stent over the original zipwire. We advanced a 6x26 JJ ureteral stent up to the renal pelvis. The wire was removed and good coil was noted in the renal pelvis under fluoroscopy and the bladder under direct vision The bladder was then drained and this concluded the procedure which was well tolerated by patient.  Complications: None  Condition: Stable, extubated, transferred to PACU  Plan: Patient will be discharged home and followup in 2 weeks for stent removal

## 2022-05-27 NOTE — Telephone Encounter (Signed)
Patient came by the office to pick up Myrbetriq 25 mg after Surgery.

## 2022-05-27 NOTE — Anesthesia Procedure Notes (Signed)
Procedure Name: LMA Insertion Date/Time: 05/27/2022 1:13 PM  Performed by: Franco Nones, CRNAPre-anesthesia Checklist: Patient identified, Patient being monitored, Timeout performed, Emergency Drugs available and Suction available Patient Re-evaluated:Patient Re-evaluated prior to induction Oxygen Delivery Method: Circle System Utilized Preoxygenation: Pre-oxygenation with 100% oxygen Induction Type: IV induction Ventilation: Mask ventilation without difficulty LMA: LMA inserted LMA Size: 4.0 Number of attempts: 1 Placement Confirmation: positive ETCO2 and breath sounds checked- equal and bilateral Tube secured with: Tape Dental Injury: Teeth and Oropharynx as per pre-operative assessment

## 2022-05-27 NOTE — H&P (Signed)
HPI: [name] is a 62yo here for evaluation of gross hematuria. Since January 2024 she had gross hematuria with associated suprapubic pain and nausea. Hematuria lasted 1 week. No tobacco abuse history. No environmental exposures. No dysuria. No significant LUTS. Father has hx of nephrolithiasis. Hematuria workup was negative and patient continues to have gross hematuria     PMH:     Past Medical History:  Diagnosis Date   Anemia     Anxiety     Arthritis     Essential hypertension     GERD (gastroesophageal reflux disease)     Lumbago     Mixed hyperlipidemia        Surgical History:      Past Surgical History:  Procedure Laterality Date   ABDOMINAL HYSTERECTOMY   1991-1992    [facility]   CARPAL TUNNEL RELEASE Right 10/26/2016    Procedure: RIGHT CARPAL TUNNEL RELEASE;  Surgeon: Cindee Salt, MD;  Location: [facility];  Service: Orthopedics;  Laterality: Right;   COLONOSCOPY N/A 07/13/2012    Procedure: COLONOSCOPY;  Surgeon: Corbin Ade, MD;  Location: [facility];  Service: Endoscopy;  Laterality: N/A;  2:30 PM-moved to [address] to notify pt   TRIGGER FINGER RELEASE Right 10/26/2016    Procedure: RELEASE TRIGGER FINGER/A-1 PULLEY RIGHT MIDDLE FINGER;  Surgeon: Cindee Salt, MD;  Location: [facility];  Service: Orthopedics;  Laterality: Right;   WISDOM TOOTH EXTRACTION          Home Medications:  Allergies as of 02/01/2022   No Known Allergies         Medication List           Accurate as of February 01, 2022  1:53 PM. If you have any questions, ask your nurse or doctor.              Biotin 1000 MCG tablet Take 1,000 mcg by mouth daily.    famotidine 20 MG tablet Commonly known as: PEPCID Take 1 tablet (20 mg total) by mouth 2 (two) times daily.    ferrous sulfate 325 (65 FE) MG tablet Take 325 mg by mouth daily with breakfast.    fish oil-omega-3 fatty acids 1000 MG capsule Take 1,200 mg by mouth daily.    LYSINE PO Take by  mouth.    magnesium 30 MG tablet Take 30 mg by mouth 2 (two) times daily.    metoprolol succinate 25 MG 24 hr tablet Commonly known as: TOPROL-XL Take 1 tablet by mouth daily.    multivitamin capsule Take 1 capsule by mouth daily.    multivitamin with minerals Tabs tablet Take 1 tablet by mouth daily.    naproxen sodium 220 MG tablet Commonly known as: ALEVE Take 220 mg by mouth daily as needed.    rosuvastatin 10 MG tablet Commonly known as: CRESTOR Take 10 mg by mouth daily.    Turmeric 450 MG Caps Take 1 capsule by mouth daily.    vitamin D3 50 MCG (2000 UT) Caps             Allergies: No Known Allergies   Family History:      Family History  Problem Relation Age of Onset   Prostate cancer Father     Heart disease Father     Diabetes Maternal Grandmother     Diabetes Paternal Grandmother     Breast cancer Paternal Aunt     Colon cancer Neg Hx  Social History:  reports that she has never smoked. She has never used smokeless tobacco. She reports current alcohol use. She reports that she does not use drugs.   ROS: All other review of systems were reviewed and are negative except what is noted above in HPI   Physical Exam: BP 120/75   Pulse 85   Constitutional:  Alert and oriented, No acute distress. HEENT: [place] AT, moist mucus membranes.  Trachea midline, no masses. Cardiovascular: No clubbing, cyanosis, or edema. Respiratory: Normal respiratory effort, no increased work of breathing. GI: Abdomen is soft, nontender, nondistended, no abdominal masses GU: No CVA tenderness.  Lymph: No cervical or inguinal lymphadenopathy. Skin: No rashes, bruises or suspicious lesions. Neurologic: Grossly intact, no focal deficits, moving all 4 extremities. Psychiatric: Normal mood and affect.   Laboratory Data: Recent Labs       Lab Results  Component Value Date    WBC 4.3 08/24/2018    HGB 12.3 08/24/2018    HCT 37 08/24/2018    MCV 92.4 04/28/2016    PLT  263 08/24/2018        Recent Labs       Lab Results  Component Value Date    CREATININE 0.80 12/30/2021        Recent Labs  No results found for: "PSA"     Recent Labs  No results found for: "TESTOSTERONE"     Recent Labs  No results found for: "HGBA1C"     Urinalysis Labs (Brief)  No results found for: "COLORURINE", "APPEARANCEUR", "LABSPEC", "PHURINE", "GLUCOSEU", "HGBUR", "BILIRUBINUR", "KETONESUR", "PROTEINUR", "UROBILINOGEN", "NITRITE", "LEUKOCYTESUR"     Recent Labs  No results found for: "LABMICR", "WBCUA", "RBCUA", "LABEPIT", "MUCUS", "BACTERIA"     Pertinent Imaging:   No results found for this or any previous visit.   No results found for this or any previous visit.   No results found for this or any previous visit.   No results found for this or any previous visit.   No results found for this or any previous visit.   No valid procedures specified. No results found for this or any previous visit.   No results found for this or any previous visit.     Assessment & Plan:     1. Gross hematuria We discussed the management of recurrent gross hematuria with a negative hematuria workup. We discussed bilateral diagnostic ureteroscopy with possible biopsy and fulgeration and after discussing the procedure the patient wishes to proceed with surgery. Risks/benefits/alternatives discussed

## 2022-05-27 NOTE — Transfer of Care (Signed)
Immediate Anesthesia Transfer of Care Note  Patient: [name]  Procedure(s) Performed: CYSTOSCOPY WITH RETROGRADE PYELOGRAM, DIAGNOSTIC URETEROSCOPY AND STENT PLACEMENT (Bilateral: Ureter)  Patient Location: PACU  Anesthesia Type:General  Level of Consciousness: awake and patient cooperative  Airway & Oxygen Therapy: Patient Spontanous Breathing  Post-op Assessment: Report given to RN and Post -op Vital signs reviewed and stable  Post vital signs: Reviewed and stable  Last Vitals:  Vitals Value Taken Time  BP 129/88 05/27/22 1352  Temp 97.8 05/27/22 1354  Pulse 60 05/27/22 1353  Resp 13 05/27/22 1353  SpO2 98 % 05/27/22 1353  Vitals shown include unvalidated device data.  Last Pain:  Vitals:   05/27/22 1157  TempSrc: Oral  PainSc: 0-No pain      Patients Stated Pain Goal: 8 (05/27/22 1157)  Complications: No notable events documented.

## 2022-05-27 NOTE — Anesthesia Preprocedure Evaluation (Signed)
Anesthesia Evaluation  Patient identified by MRN, date of birth, ID band Patient awake    Reviewed: Allergy & Precautions, H&P , NPO status , Patient's Chart, lab work & pertinent test results, reviewed documented beta blocker date and time   Airway Mallampati: II  TM Distance: >3 FB Neck ROM: full    Dental no notable dental hx.    Pulmonary neg pulmonary ROS   Pulmonary exam normal breath sounds clear to auscultation       Cardiovascular Exercise Tolerance: Good hypertension, negative cardio ROS  Rhythm:regular Rate:Normal     Neuro/Psych   Anxiety     negative neurological ROS  negative psych ROS   GI/Hepatic negative GI ROS, Neg liver ROS,GERD  ,,  Endo/Other  negative endocrine ROS    Renal/GU negative Renal ROS  negative genitourinary   Musculoskeletal   Abdominal   Peds  Hematology negative hematology ROS (+) Blood dyscrasia, anemia   Anesthesia Other Findings   Reproductive/Obstetrics negative OB ROS                             Anesthesia Physical Anesthesia Plan  ASA: 2  Anesthesia Plan: General and General LMA   Post-op Pain Management:    Induction:   PONV Risk Score and Plan: Ondansetron  Airway Management Planned:   Additional Equipment:   Intra-op Plan:   Post-operative Plan:   Informed Consent: I have reviewed the patients History and Physical, chart, labs and discussed the procedure including the risks, benefits and alternatives for the proposed anesthesia with the patient or authorized representative who has indicated his/her understanding and acceptance.     Dental Advisory Given  Plan Discussed with: CRNA  Anesthesia Plan Comments:        Anesthesia Quick Evaluation  

## 2022-05-30 NOTE — Anesthesia Postprocedure Evaluation (Signed)
Anesthesia Post Note  Patient: Yatzary Stelmack  Procedure(s) Performed: CYSTOSCOPY WITH RETROGRADE PYELOGRAM, DIAGNOSTIC URETEROSCOPY AND STENT PLACEMENT (Bilateral: Ureter)  Patient location during evaluation: Phase II Anesthesia Type: General Level of consciousness: awake Pain management: pain level controlled Vital Signs Assessment: post-procedure vital signs reviewed and stable Respiratory status: spontaneous breathing and respiratory function stable Cardiovascular status: blood pressure returned to baseline and stable Postop Assessment: no headache and no apparent nausea or vomiting Anesthetic complications: no Comments: Late entry   No notable events documented.   Last Vitals:  Vitals:   05/27/22 1415 05/27/22 1428  BP: 132/79 (!) 144/86  Pulse: (!) 56 61  Resp: 12 16  Temp:  (!) 36.3 C  SpO2: 98% 100%    Last Pain:  Vitals:   05/28/22 1423  TempSrc:   PainSc: 5                  Windell Norfolk

## 2022-05-31 ENCOUNTER — Telehealth: Payer: Self-pay

## 2022-05-31 NOTE — Telephone Encounter (Signed)
Patient states she has very little energy and is having severe pressure near her bikini incision line that radiates to her back and left hip. Patient states she gets some relief from her heating pad but she is very uncomfortable. Patient informed her symptoms are from having the stent in place. Patient request to have stents removed sooner if possible. 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 Will patient need to remain out of work until stents are removed? Patient is scheduled to have stents removed on 5/18 please advise.

## 2022-05-31 NOTE — Telephone Encounter (Signed)
Patient left a voice message 05-31-2022.  Patient states she is having severe cramping and pressure.  Feeling like she needs to push, like having a child. Her bleeding has mostly subsided.  She is asking if the stent can be removed sooner due to the symptoms draining her energy and having to urinate every 5 minutes with little coming out.    Please advise.  Call back:  [phone]

## 2022-06-01 ENCOUNTER — Encounter (HOSPITAL_COMMUNITY): Payer: Self-pay | Admitting: Urology

## 2022-06-01 DIAGNOSIS — R310 Gross hematuria: Secondary | ICD-10-CM

## 2022-06-01 NOTE — Telephone Encounter (Signed)
Patient called with no answer. Detailed message left informing pt per Dr. Ronne Binning she can come pick up myrbetriq 25 mg samples to help with the discomfort of having stent in.

## 2022-06-02 NOTE — Telephone Encounter (Signed)
Patient called in today and voiced that she is having frequency after stent placement . Patient states that she is having discomfort and can not walk around her home and would like a work note to be out of work until after stent pull at her follow up visit. Patient is aware that a work note will be sent to her my chart. Patient voiced understanding.

## 2022-06-03 DIAGNOSIS — N200 Calculus of kidney: Secondary | ICD-10-CM | POA: Insufficient documentation

## 2022-06-08 ENCOUNTER — Encounter: Payer: Self-pay | Admitting: *Deleted

## 2022-06-09 ENCOUNTER — Ambulatory Visit (INDEPENDENT_AMBULATORY_CARE_PROVIDER_SITE_OTHER): Payer: Managed Care, Other (non HMO) | Admitting: Urology

## 2022-06-09 ENCOUNTER — Encounter: Payer: Self-pay | Admitting: Urology

## 2022-06-09 VITALS — BP 96/67 | HR 80

## 2022-06-09 DIAGNOSIS — Z466 Encounter for fitting and adjustment of urinary device: Secondary | ICD-10-CM | POA: Diagnosis not present

## 2022-06-09 DIAGNOSIS — R310 Gross hematuria: Secondary | ICD-10-CM

## 2022-06-09 LAB — MICROSCOPIC EXAMINATION
Bacteria, UA: NONE SEEN
RBC, Urine: >30 /hpf — AB (ref 0–2)

## 2022-06-09 LAB — URINALYSIS, ROUTINE W REFLEX MICROSCOPIC
Bilirubin, UA: NEGATIVE
Nitrite, UA: POSITIVE — AB
Specific Gravity, UA: 1.025 (ref 1.005–1.030)
Urobilinogen, Ur: 1 mg/dL (ref 0.2–1.0)
pH, UA: 6.5 (ref 5.0–7.5)

## 2022-06-09 MED ORDER — CIPROFLOXACIN HCL 500 MG PO TABS
500.0000 mg | ORAL_TABLET | Freq: Once | ORAL | Status: AC
Start: 2022-06-09 — End: 2022-06-09
  Administered 2022-06-09: 500 mg via ORAL

## 2022-06-09 NOTE — Progress Notes (Unsigned)
   06/09/22  CC: followup stent removal   HPI:  Blood pressure 96/67, pulse 80. NED. A&Ox3.   No respiratory distress   Abd soft, NT, ND Normal external genitalia with patent urethral meatus  Cystoscopy Procedure Note  Patient identification was confirmed, informed consent was obtained, and patient was prepped using Betadine solution.  Lidocaine jelly was administered per urethral meatus.    Procedure: - Flexible cystoscope introduced, without any difficulty.   - Thorough search of the bladder revealed:    normal urethral meatus    normal urothelium    no stones    no ulcers     no tumors    no urethral polyps    no trabeculation  - Ureteral orifices were normal in position and appearance. Using a grasper the bilateral ureteral stents were  removed intact Post-Procedure: - Patient tolerated the procedure well  Assessment/ Plan:  Followup 6 weeks with renal US  No follow-ups on file.  Wilkie Aye, MD

## 2022-06-09 NOTE — Patient Instructions (Signed)

## 2022-06-24 ENCOUNTER — Telehealth: Payer: Self-pay | Admitting: *Deleted

## 2022-06-24 ENCOUNTER — Telehealth: Payer: Self-pay | Admitting: Internal Medicine

## 2022-06-24 NOTE — Telephone Encounter (Signed)
Questionnaire received and sent to provider to review

## 2022-06-24 NOTE — Telephone Encounter (Signed)
Procedure: Colonoscopy  Height: 5'4 Weight: 187lbs        Have you had a colonoscopy before?  07/13/12, Dr. Jena Gauss  Do you have family history of colon cancer?  no  Do you have a family history of polyps? no  Previous colonoscopy with polyps removed? no  Do you have a history colorectal cancer?   no  Are you diabetic?  no  Do you have a prosthetic or mechanical heart valve? no  Do you have a pacemaker/defibrillator?   no  Have you had endocarditis/atrial fibrillation?  no  Do you use supplemental oxygen/CPAP?  no  Have you had joint replacement within the last 12 months?  no  Do you tend to be constipated or have to use laxatives?  no   Do you have history of alcohol use? If yes, how much and how often.  Yes social/occasional  Do you have history or are you using drugs? If yes, what do are you  using?  no  Have you ever had a stroke/heart attack?  no  Have you ever had a heart or other vascular stent placed,?no  Do you take weight loss medication? no  female patients,: have you had a hysterectomy? yes                              are you post menopausal?                                do you still have your menstrual cycle?     Date of last menstrual period?   Do you take any blood-thinning medications such as: (Plavix, aspirin, Coumadin, Aggrenox, Brilinta, Xarelto, Eliquis, Pradaxa, Savaysa or Effient)? no  If yes we need the name, milligram, dosage and who is prescribing doctor:               Current Outpatient Medications  Medication Sig Dispense Refill   Biotin 1000 MCG tablet Take 1,000 mcg by mouth daily.      Cholecalciferol (VITAMIN D3) 50 MCG (2000 UT) CAPS      famotidine (PEPCID) 20 MG tablet Take 1 tablet (20 mg total) by mouth 2 (two) times daily. 30 tablet 0   ferrous sulfate 325 (65 FE) MG tablet Take 325 mg by mouth daily with breakfast.     fish oil-omega-3 fatty acids 1000 MG capsule Take 1,200 mg by mouth daily.     LYSINE PO Take by mouth.      magnesium 30 MG tablet Take 30 mg by mouth 2 (two) times daily.     metoprolol succinate (TOPROL-XL) 25 MG 24 hr tablet Take 1 tablet by mouth daily.     Multiple Vitamin (MULTIVITAMIN WITH MINERALS) TABS tablet Take 1 tablet by mouth daily.     Multiple Vitamin (MULTIVITAMIN) capsule Take 1 capsule by mouth daily.     naproxen sodium (ALEVE) 220 MG tablet Take 220 mg by mouth daily as needed.     ondansetron (ZOFRAN) 4 MG tablet Take 1 tablet (4 mg total) by mouth daily as needed for nausea or vomiting. 30 tablet 1   oxyCODONE-acetaminophen (PERCOCET) 5-325 MG tablet Take 1 tablet by mouth every 4 (four) hours as needed. 30 tablet 0   rosuvastatin (CRESTOR) 10 MG tablet Take 10 mg by mouth daily.     tamsulosin (FLOMAX) 0.4 MG CAPS capsule Take 1 capsule (  0.4 mg total) by mouth daily. 30 capsule 0   Turmeric 450 MG CAPS Take 1 capsule by mouth daily.     No current facility-administered medications for this visit.    No Known Allergies

## 2022-07-03 ENCOUNTER — Other Ambulatory Visit: Payer: Self-pay | Admitting: Urology

## 2022-07-12 ENCOUNTER — Other Ambulatory Visit: Payer: Self-pay

## 2022-07-12 ENCOUNTER — Encounter (HOSPITAL_COMMUNITY): Payer: Self-pay

## 2022-07-12 ENCOUNTER — Encounter (HOSPITAL_COMMUNITY)
Admission: RE | Admit: 2022-07-12 | Discharge: 2022-07-12 | Disposition: A | Discharge status: Home / Self Care | Payer: Managed Care, Other (non HMO) | Source: Ambulatory Visit | Attending: Ophthalmology | Admitting: Ophthalmology

## 2022-07-15 NOTE — H&P (Signed)
Surgical History & Physical  Patient Name: [name]  DOB: 1960-04-20  Surgery: Cataract extraction with intraocular lens implant phacoemulsification; Right Eye Surgeon: [name] MD Surgery Date: 07/19/2022 Pre-Op Date: 06/17/2022  HPI: A 6 Yr. old female patient 1. The patient complains of difficulty when driving due to glare from headlights or sun, reading fine print like on medicine bottles, which has progressed over the past 2-3 months ago. Both eyes are affected, OD>OS. The episode is constant. This is negatively affecting the patient's quality of life and the patient is unable to function adequately in life with the current level of vision. HPI was performed by [name] .  Medical History: Cataracts  Heart Problem LDL  Review of Systems Negative Allergic/Immunologic Negative Cardiovascular Negative Constitutional Negative Ear, Nose, Mouth & Throat Negative Endocrine Negative Eyes Negative Gastrointestinal Negative Genitourinary Negative Hemotologic/Lymphatic Negative Integumentary Negative Musculoskeletal Negative Neurological Negative Psychiatry Negative Respiratory  Social Never smoked   Medication Rosuvastatin, Metoprolol  Sx/Procedures Kidney stones with stent, Carpal Tunnel - right, Hysterectomy   Allergies NKDA  History & Physical: Heent: cataracts NECK: supple without bruits LUNGS: lungs clear to auscultation CV: regular rate and rhythm Abdomen: soft and non-tender  Impression & Plan: Assessment: 1.  COMBINED FORMS AGE RELATED CATARACT; Right Eye (H25.811) 2.  NUCLEAR SCLEROSIS AGE RELATED; Left Eye (H25.12) 3.  ASTIGMATISM, REGULAR; Both Eyes (H52.223) 4.  BLEPHARITIS; Right Upper Lid, Right Lower Lid, Left Upper Lid, Left Lower Lid (H01.001, H01.002,H01.004,H01.005) 5.  DERMATOCHALASIS, no surgery; Right Upper Lid, Left Upper Lid (H02.831, L24.401)  Plan: 1.  Cataract accounts for the patient's decreased vision. This visual  impairment is not correctable with a tolerable change in glasses or contact lenses. Cataract surgery with an implantation of a new lens should significantly improve the visual and functional status of the patient. Discussed all risks, benefits, alternatives, and potential complications. Discussed the procedures and recovery. Patient desires to have surgery. A-scan ordered and performed today for intra-ocular lens calculations. The surgery will be performed in order to improve vision for driving, reading, and for eye examinations. Recommend phacoemulsification with intra-ocular lens. Recommend Dextenza for post-operative pain and inflammation. Right Eye worse - first. Dilates well - shugarcaine by protocol. Synergy Toric Lens.  2.  Will address after right eye. Hyperopic - may have significant imbalance after right eye is done.  3.  Recommend Toric IOL OD only.  4.  Recommend regular lid cleaning. Warm compresses 7-10 minutes every day, both eyes.  5.  Asymptomatic, recommend observation for now. Findings, prognosis and treatment options reviewed.

## 2022-07-19 ENCOUNTER — Encounter (HOSPITAL_COMMUNITY): Payer: Self-pay | Admitting: Ophthalmology

## 2022-07-19 ENCOUNTER — Ambulatory Visit (HOSPITAL_COMMUNITY): Payer: Managed Care, Other (non HMO) | Admitting: Certified Registered"

## 2022-07-19 ENCOUNTER — Encounter (HOSPITAL_COMMUNITY)
Admission: RE | Disposition: A | Discharge status: Home / Self Care | Payer: Self-pay | Source: Home / Self Care | Attending: Ophthalmology

## 2022-07-19 ENCOUNTER — Ambulatory Visit (HOSPITAL_BASED_OUTPATIENT_CLINIC_OR_DEPARTMENT_OTHER): Payer: Managed Care, Other (non HMO) | Admitting: Certified Registered"

## 2022-07-19 ENCOUNTER — Ambulatory Visit (HOSPITAL_COMMUNITY)
Admission: RE | Admit: 2022-07-19 | Discharge: 2022-07-19 | Disposition: A | Discharge status: Home / Self Care | Payer: Managed Care, Other (non HMO) | Attending: Ophthalmology | Admitting: Ophthalmology

## 2022-07-19 DIAGNOSIS — H01002 Unspecified blepharitis right lower eyelid: Secondary | ICD-10-CM | POA: Diagnosis not present

## 2022-07-19 DIAGNOSIS — H01005 Unspecified blepharitis left lower eyelid: Secondary | ICD-10-CM | POA: Insufficient documentation

## 2022-07-19 DIAGNOSIS — H52223 Regular astigmatism, bilateral: Secondary | ICD-10-CM | POA: Insufficient documentation

## 2022-07-19 DIAGNOSIS — I10 Essential (primary) hypertension: Secondary | ICD-10-CM

## 2022-07-19 DIAGNOSIS — F419 Anxiety disorder, unspecified: Secondary | ICD-10-CM

## 2022-07-19 DIAGNOSIS — H25811 Combined forms of age-related cataract, right eye: Secondary | ICD-10-CM | POA: Diagnosis not present

## 2022-07-19 DIAGNOSIS — D649 Anemia, unspecified: Secondary | ICD-10-CM

## 2022-07-19 DIAGNOSIS — H01004 Unspecified blepharitis left upper eyelid: Secondary | ICD-10-CM | POA: Insufficient documentation

## 2022-07-19 DIAGNOSIS — H02834 Dermatochalasis of left upper eyelid: Secondary | ICD-10-CM | POA: Insufficient documentation

## 2022-07-19 DIAGNOSIS — H01001 Unspecified blepharitis right upper eyelid: Secondary | ICD-10-CM | POA: Diagnosis not present

## 2022-07-19 DIAGNOSIS — H02831 Dermatochalasis of right upper eyelid: Secondary | ICD-10-CM | POA: Diagnosis not present

## 2022-07-19 HISTORY — PX: CATARACT EXTRACTION W/PHACO: SHX586

## 2022-07-19 SURGERY — PHACOEMULSIFICATION, CATARACT, WITH IOL INSERTION
Anesthesia: Monitor Anesthesia Care | Site: Eye | Laterality: Right

## 2022-07-19 MED ORDER — LIDOCAINE HCL 3.5 % OP GEL
1.0000 | Freq: Once | OPHTHALMIC | Status: AC
  Administered 2022-07-19: 1 via OPHTHALMIC

## 2022-07-19 MED ORDER — SODIUM CHLORIDE 0.9% FLUSH
INTRAVENOUS | Status: DC | PRN
  Administered 2022-07-19: 5 mL via INTRAVENOUS

## 2022-07-19 MED ORDER — STERILE WATER FOR IRRIGATION IR SOLN
Status: DC | PRN
  Administered 2022-07-19: 250 mL

## 2022-07-19 MED ORDER — SODIUM HYALURONATE 23MG/ML IO SOSY
PREFILLED_SYRINGE | INTRAOCULAR | Status: DC | PRN
  Administered 2022-07-19: .6 mL via INTRAOCULAR

## 2022-07-19 MED ORDER — SODIUM HYALURONATE 10 MG/ML IO SOLUTION
PREFILLED_SYRINGE | INTRAOCULAR | Status: DC | PRN
  Administered 2022-07-19: .85 mL via INTRAOCULAR

## 2022-07-19 MED ORDER — LIDOCAINE HCL (PF) 1 % IJ SOLN
INTRAOCULAR | Status: DC | PRN
  Administered 2022-07-19: 1 mL via OPHTHALMIC

## 2022-07-19 MED ORDER — TROPICAMIDE 1 % OP SOLN
1.0000 [drp] | OPHTHALMIC | Status: AC | PRN
  Administered 2022-07-19 (×3): 1 [drp] via OPHTHALMIC

## 2022-07-19 MED ORDER — MIDAZOLAM HCL 2 MG/2ML IJ SOLN
INTRAMUSCULAR | Status: DC | PRN
  Administered 2022-07-19: 2 mg via INTRAVENOUS

## 2022-07-19 MED ORDER — EPINEPHRINE PF 1 MG/ML IJ SOLN
INTRAMUSCULAR | Status: AC
  Filled 2022-07-19: qty 1

## 2022-07-19 MED ORDER — TETRACAINE HCL 0.5 % OP SOLN
1.0000 [drp] | OPHTHALMIC | Status: AC | PRN
  Administered 2022-07-19 (×3): 1 [drp] via OPHTHALMIC

## 2022-07-19 MED ORDER — PHENYLEPHRINE HCL 2.5 % OP SOLN
1.0000 [drp] | OPHTHALMIC | Status: AC | PRN
  Administered 2022-07-19 (×3): 1 [drp] via OPHTHALMIC

## 2022-07-19 MED ORDER — EPINEPHRINE PF 1 MG/ML IJ SOLN
INTRAOCULAR | Status: DC | PRN
  Administered 2022-07-19: 500 mL

## 2022-07-19 MED ORDER — BSS IO SOLN
INTRAOCULAR | Status: DC | PRN
  Administered 2022-07-19: 15 mL via INTRAOCULAR

## 2022-07-19 MED ORDER — NEOMYCIN-POLYMYXIN-DEXAMETH 3.5-10000-0.1 OP SUSP
OPHTHALMIC | Status: DC | PRN
  Administered 2022-07-19: 2 [drp] via OPHTHALMIC

## 2022-07-19 MED ORDER — MIDAZOLAM HCL 2 MG/2ML IJ SOLN
INTRAMUSCULAR | Status: AC
  Filled 2022-07-19: qty 2

## 2022-07-19 MED ORDER — POVIDONE-IODINE 5 % OP SOLN
OPHTHALMIC | Status: DC | PRN
  Administered 2022-07-19: 1 via OPHTHALMIC

## 2022-07-19 SURGICAL SUPPLY — 13 items
CATARACT SUITE SIGHTPATH (MISCELLANEOUS) ×1 IMPLANT
CLOTH BEACON ORANGE TIMEOUT ST (SAFETY) ×2 IMPLANT
EYE SHIELD UNIVERSAL CLEAR (GAUZE/BANDAGES/DRESSINGS) IMPLANT
FEE CATARACT SUITE SIGHTPATH (MISCELLANEOUS) ×2 IMPLANT
GLOVE BIOGEL PI IND STRL 7.0 (GLOVE) ×4 IMPLANT
LENS IOL TECNIS EYHANCE 23.0 (Intraocular Lens) IMPLANT
NDL HYPO 18GX1.5 BLUNT FILL (NEEDLE) ×2 IMPLANT
NEEDLE HYPO 18GX1.5 BLUNT FILL (NEEDLE) ×1 IMPLANT
PAD ARMBOARD 7.5X6 YLW CONV (MISCELLANEOUS) ×2 IMPLANT
RING MALYGIN 7.0 (MISCELLANEOUS) IMPLANT
SYR TB 1ML LL NO SAFETY (SYRINGE) ×2 IMPLANT
TAPE SURG TRANSPORE 1 IN (GAUZE/BANDAGES/DRESSINGS) IMPLANT
WATER STERILE IRR 250ML POUR (IV SOLUTION) ×2 IMPLANT

## 2022-07-19 NOTE — Anesthesia Preprocedure Evaluation (Signed)
Anesthesia Evaluation  Patient identified by MRN, date of birth, ID band Patient awake    Reviewed: Allergy & Precautions, H&P , NPO status , Patient's Chart, lab work & pertinent test results, reviewed documented beta blocker date and time   Airway Mallampati: II  TM Distance: >3 FB Neck ROM: full    Dental no notable dental hx.    Pulmonary neg pulmonary ROS   Pulmonary exam normal breath sounds clear to auscultation       Cardiovascular Exercise Tolerance: Good hypertension, negative cardio ROS  Rhythm:regular Rate:Normal     Neuro/Psych   Anxiety     negative neurological ROS  negative psych ROS   GI/Hepatic negative GI ROS, Neg liver ROS,GERD  ,,  Endo/Other  negative endocrine ROS    Renal/GU negative Renal ROS  negative genitourinary   Musculoskeletal   Abdominal   Peds  Hematology negative hematology ROS (+) Blood dyscrasia, anemia   Anesthesia Other Findings   Reproductive/Obstetrics negative OB ROS                             Anesthesia Physical Anesthesia Plan  ASA: 2  Anesthesia Plan: MAC   Post-op Pain Management:    Induction:   PONV Risk Score and Plan:   Airway Management Planned:   Additional Equipment:   Intra-op Plan:   Post-operative Plan:   Informed Consent: I have reviewed the patients History and Physical, chart, labs and discussed the procedure including the risks, benefits and alternatives for the proposed anesthesia with the patient or authorized representative who has indicated his/her understanding and acceptance.     Dental Advisory Given  Plan Discussed with: CRNA  Anesthesia Plan Comments:        Anesthesia Quick Evaluation

## 2022-07-19 NOTE — Op Note (Signed)
Date of procedure: 07/19/22  Pre-operative diagnosis:  Visually significant combined form age-related cataract, Right Eye (H25.811)  Post-operative diagnosis:  Visually significant combined form age-related cataract, Right Eye (H25.811)  Procedure: Removal of cataract via phacoemulsification and insertion of intra-ocular lens [employer] DIB00 +23.0D into the capsular bag of the Right Eye  Attending surgeon: Rudy Jew. Zadaya Cuadra, MD, MA  Anesthesia: MAC, Topical Akten  Complications: None  Estimated Blood Loss: <80mL (minimal)  Specimens: None  Implants: As above  Indications:  Visually significant age-related cataract, Right Eye  Procedure:  The patient was seen and identified in the pre-operative area. The operative eye was identified and dilated.  The operative eye was marked.  Topical anesthesia was administered to the operative eye.     The patient was then to the operative suite and placed in the supine position.  A timeout was performed confirming the patient, procedure to be performed, and all other relevant information.   The patient's face was prepped and draped in the usual fashion for intra-ocular surgery.  A lid speculum was placed into the operative eye and the surgical microscope moved into place and focused.  A superotemporal paracentesis was created using a 20 gauge paracentesis blade.  Shugarcaine was injected into the anterior chamber.  Viscoelastic was injected into the anterior chamber.  A temporal clear-corneal main wound incision was created using a 2.33mm microkeratome.  A continuous curvilinear capsulorrhexis was initiated using an irrigating cystitome and completed using capsulorrhexis forceps.  Hydrodissection and hydrodeliniation were performed.  Viscoelastic was injected into the anterior chamber.  A phacoemulsification handpiece and a chopper as a second instrument were used to remove the nucleus and epinucleus. The irrigation/aspiration handpiece was used to  remove any remaining cortical material.   The capsular bag was reinflated with viscoelastic, checked, and found to be intact.  The intraocular lens was inserted into the capsular bag.  The irrigation/aspiration handpiece was used to remove any remaining viscoelastic.  The clear corneal wound and paracentesis wounds were then hydrated and checked with Weck-Cels to be watertight. Maxitrol drops were instilled into the operative eye.  The lid-speculum was removed.  The drape was removed.  The patient's face was cleaned with a wet and dry 4x4. A clear shield was taped over the eye. The patient was taken to the post-operative care unit in good condition, having tolerated the procedure well.  Post-Op Instructions: The patient will follow up at [facility] for a same day post-operative evaluation and will receive all other orders and instructions.

## 2022-07-19 NOTE — Anesthesia Procedure Notes (Signed)
Procedure Name: MAC Date/Time: 07/19/2022 7:56 AM  Performed by: Julian Reil, CRNAPre-anesthesia Checklist: Patient identified, Emergency Drugs available, Suction available and Patient being monitored Patient Re-evaluated:Patient Re-evaluated prior to induction Oxygen Delivery Method: Nasal cannula Placement Confirmation: positive ETCO2

## 2022-07-19 NOTE — Interval H&P Note (Signed)
History and Physical Interval Note:  07/19/2022 7:51 AM  [name]  has presented today for surgery, with the diagnosis of combined forms age related cataract; right.  The various methods of treatment have been discussed with the patient and family. After consideration of risks, benefits and other options for treatment, the patient has consented to  Procedure(s) with comments: CATARACT EXTRACTION PHACO AND INTRAOCULAR LENS PLACEMENT (IOC) (Right) - CDE: as a surgical intervention.  The patient's history has been reviewed, patient examined, no change in status, stable for surgery.  I have reviewed the patient's chart and labs.  Questions were answered to the patient's satisfaction.     [name]

## 2022-07-19 NOTE — Discharge Instructions (Signed)
Please discharge patient when stable, will follow up today with Dr. Kitt Minardi at the [facility] [place] office immediately following discharge.  Leave shield in place until visit.  All paperwork with discharge instructions will be given at the office.  [facility] [place] Address:  [address]  [place], [place] [address]  

## 2022-07-19 NOTE — Transfer of Care (Signed)
Immediate Anesthesia Transfer of Care Note  Patient: [name]  Procedure(s) Performed: CATARACT EXTRACTION PHACO AND INTRAOCULAR LENS PLACEMENT (IOC) (Right: Eye)  Patient Location: Short Stay  Anesthesia Type:MAC  Level of Consciousness: awake, alert , and oriented  Airway & Oxygen Therapy: Patient Spontanous Breathing  Post-op Assessment: Report given to RN and Post -op Vital signs reviewed and stable  Post vital signs: Reviewed and stable  Last Vitals:  Vitals Value Taken Time  BP    Temp    Pulse    Resp    SpO2      Last Pain:  Vitals:   07/19/22 0711  PainSc: 0-No pain         Complications: No notable events documented.

## 2022-07-19 NOTE — Anesthesia Postprocedure Evaluation (Signed)
Anesthesia Post Note  Patient: Janijah Symons  Procedure(s) Performed: CATARACT EXTRACTION PHACO AND INTRAOCULAR LENS PLACEMENT (IOC) (Right: Eye)  Patient location during evaluation: Phase II Anesthesia Type: MAC Level of consciousness: awake Pain management: pain level controlled Vital Signs Assessment: post-procedure vital signs reviewed and stable Respiratory status: spontaneous breathing and respiratory function stable Cardiovascular status: blood pressure returned to baseline and stable Postop Assessment: no headache and no apparent nausea or vomiting Anesthetic complications: no Comments: Late entry   No notable events documented.   Last Vitals:  Vitals:   07/19/22 0711 07/19/22 0814  BP: 109/77 102/64  Pulse: 72 71  Resp: 16 14  Temp: 36.6 C 36.6 C  SpO2: 98% 100%    Last Pain:  Vitals:   07/19/22 0814  TempSrc: Oral  PainSc: 0-No pain                 Windell Norfolk

## 2022-07-20 ENCOUNTER — Other Ambulatory Visit: Payer: Self-pay | Admitting: *Deleted

## 2022-07-20 ENCOUNTER — Encounter: Payer: Self-pay | Admitting: *Deleted

## 2022-07-20 MED ORDER — PEG 3350-KCL-NA BICARB-NACL 420 G PO SOLR: 4000.0000 mL | Freq: Once | ORAL | 0 refills | Status: AC

## 2022-07-20 NOTE — Telephone Encounter (Signed)
Okay to schedule.  ASA 2.  Hold iron x 7 days prior to procedure.

## 2022-07-20 NOTE — Telephone Encounter (Signed)
Pt has been scheduled for 08/19/22. Instructions mailed and prep sent to the pharmacy.

## 2022-07-21 ENCOUNTER — Encounter (HOSPITAL_COMMUNITY): Payer: Self-pay | Admitting: Ophthalmology

## 2022-07-21 NOTE — Telephone Encounter (Signed)
Questionnaire from recall, no referral needed  

## 2022-07-22 ENCOUNTER — Ambulatory Visit: Payer: Managed Care, Other (non HMO) | Admitting: Urology

## 2022-07-28 ENCOUNTER — Ambulatory Visit (HOSPITAL_COMMUNITY)
Admission: RE | Admit: 2022-07-28 | Discharge: 2022-07-28 | Disposition: A | Discharge status: Home / Self Care | Payer: Managed Care, Other (non HMO) | Source: Ambulatory Visit | Attending: Urology | Admitting: Urology

## 2022-07-28 DIAGNOSIS — R310 Gross hematuria: Secondary | ICD-10-CM | POA: Insufficient documentation

## 2022-08-10 ENCOUNTER — Other Ambulatory Visit: Payer: Self-pay

## 2022-08-10 ENCOUNTER — Encounter (HOSPITAL_COMMUNITY)
Admission: RE | Admit: 2022-08-10 | Discharge: 2022-08-10 | Disposition: A | Discharge status: Home / Self Care | Payer: Managed Care, Other (non HMO) | Source: Ambulatory Visit | Attending: Ophthalmology | Admitting: Ophthalmology

## 2022-08-10 ENCOUNTER — Encounter (HOSPITAL_COMMUNITY): Payer: Self-pay

## 2022-08-10 NOTE — Pre-Procedure Instructions (Signed)
Attempted pre-op phone call. Left Vm for her to call us back. 

## 2022-08-11 NOTE — H&P (Signed)
Surgical History & Physical  Patient Name: [name]  DOB: 1960-02-15  Surgery: Cataract extraction with intraocular lens implant phacoemulsification; Left Eye Surgeon: [name] MD Surgery Date: 08/13/2022 Pre-Op Date: 08/05/2022  HPI: A 89 Yr. old female patient 1. The patient is returning after cataract post-op. The right eye is affected. Status post cataract post-op, which began 2 weeks ago: Since the last visit, the affected area is doing well. The patient's vision is stable. Patient is following medication instructions. The patient complains of difficulty when driving due to glare from headlights or sun, reading fine print like on medicine bottles, which has progressed over the past 2-3 months ago in left eye. The episode is constant. Patient is having significant trouble getting eyes to focus together and sometimes has to shut one eye to help. This is negatively affecting the patient's quality of life and the patient is unable to function adequately in life with the current level of vision. HPI was performed by [name] .  Medical History: Cataracts  Heart Problem LDL  Review of Systems Negative Allergic/Immunologic Negative Cardiovascular Negative Constitutional Negative Ear, Nose, Mouth & Throat Negative Endocrine Negative Eyes Negative Gastrointestinal Negative Genitourinary Negative Hemotologic/Lymphatic Negative Integumentary Negative Musculoskeletal Negative Neurological Negative Psychiatry Negative Respiratory  Social Never smoked   Medication Prednisolone-Moxifloxacin-Bromfenac,  Rosuvastatin, Metoprolol  Sx/Procedures Phaco c IOL OD,  Kidney stones with stent, Carpal Tunnel - right, Hysterectomy  Drug Allergies  NKDA  History & Physical: Heent: PCL OD, cataract OS NECK: supple without bruits LUNGS: lungs clear to auscultation CV: regular rate and rhythm Abdomen: soft and non-tender  Impression & Plan: Assessment: 1.  CATARACT  EXTRACTION STATUS; Right Eye (Z98.41) 2.  INTRAOCULAR LENS IOL ; Right Eye (Z96.1) 3.  NUCLEAR SCLEROSIS AGE RELATED; Left Eye (H25.12)  Plan: 1.  17 days after cataract surgery. Doing well with improved vision and normal eye pressure. Call with any problems or concerns. Continue Pred-Moxi-Brom 2x/day for 2 more weeks.  2.  Doing well since surgery Continue Post-op medications  3.  Cataract accounts for the patient's decreased vision. This visual impairment is not correctable with a tolerable change in glasses or contact lenses. Cataract surgery with an implantation of a new lens should significantly improve the visual and functional status of the patient. Discussed all risks, benefits, alternatives, and potential complications. Discussed the procedures and recovery. Patient desires to have surgery. A-scan ordered and performed today for intra-ocular lens calculations. The surgery will be performed in order to improve vision for driving, reading, and for eye examinations. Recommend phacoemulsification with intra-ocular lens. Recommend Dextenza for post-operative pain and inflammation. Left Eye. Surgery required to correct imbalance of vision. Dilates well - shugarcaine by protocol.

## 2022-08-19 ENCOUNTER — Encounter (HOSPITAL_COMMUNITY): Payer: Self-pay | Admitting: Internal Medicine

## 2022-08-19 ENCOUNTER — Ambulatory Visit: Payer: Managed Care, Other (non HMO) | Admitting: Urology

## 2022-08-19 ENCOUNTER — Ambulatory Visit (HOSPITAL_COMMUNITY): Payer: Managed Care, Other (non HMO) | Admitting: Certified Registered"

## 2022-08-19 ENCOUNTER — Encounter (HOSPITAL_COMMUNITY)
Admission: RE | Disposition: A | Discharge status: Home / Self Care | Payer: Self-pay | Source: Home / Self Care | Attending: Internal Medicine

## 2022-08-19 ENCOUNTER — Other Ambulatory Visit: Payer: Self-pay

## 2022-08-19 ENCOUNTER — Ambulatory Visit (HOSPITAL_COMMUNITY)
Admission: RE | Admit: 2022-08-19 | Discharge: 2022-08-19 | Disposition: A | Discharge status: Home / Self Care | Payer: Managed Care, Other (non HMO) | Attending: Internal Medicine | Admitting: Internal Medicine

## 2022-08-19 DIAGNOSIS — K648 Other hemorrhoids: Secondary | ICD-10-CM | POA: Insufficient documentation

## 2022-08-19 DIAGNOSIS — M1990 Unspecified osteoarthritis, unspecified site: Secondary | ICD-10-CM | POA: Diagnosis not present

## 2022-08-19 DIAGNOSIS — K635 Polyp of colon: Secondary | ICD-10-CM | POA: Diagnosis not present

## 2022-08-19 DIAGNOSIS — I10 Essential (primary) hypertension: Secondary | ICD-10-CM | POA: Diagnosis not present

## 2022-08-19 DIAGNOSIS — E782 Mixed hyperlipidemia: Secondary | ICD-10-CM | POA: Diagnosis not present

## 2022-08-19 DIAGNOSIS — Z1211 Encounter for screening for malignant neoplasm of colon: Secondary | ICD-10-CM | POA: Diagnosis not present

## 2022-08-19 DIAGNOSIS — D122 Benign neoplasm of ascending colon: Secondary | ICD-10-CM | POA: Diagnosis not present

## 2022-08-19 DIAGNOSIS — F419 Anxiety disorder, unspecified: Secondary | ICD-10-CM | POA: Diagnosis not present

## 2022-08-19 DIAGNOSIS — K219 Gastro-esophageal reflux disease without esophagitis: Secondary | ICD-10-CM | POA: Diagnosis not present

## 2022-08-19 HISTORY — PX: COLONOSCOPY WITH PROPOFOL: SHX5780

## 2022-08-19 HISTORY — PX: POLYPECTOMY: SHX5525

## 2022-08-19 SURGERY — COLONOSCOPY WITH PROPOFOL
Anesthesia: General

## 2022-08-19 MED ORDER — LIDOCAINE HCL (CARDIAC) PF 100 MG/5ML IV SOSY
PREFILLED_SYRINGE | INTRAVENOUS | Status: DC | PRN
  Administered 2022-08-19: 80 mg via INTRAVENOUS

## 2022-08-19 MED ORDER — LACTATED RINGERS IV SOLN: INTRAVENOUS | Status: DC

## 2022-08-19 MED ORDER — SODIUM CHLORIDE 0.9 % IV SOLN: INTRAVENOUS | Status: DC | PRN

## 2022-08-19 MED ORDER — PROPOFOL 1000 MG/100ML IV EMUL
INTRAVENOUS | Status: AC
  Filled 2022-08-19: qty 100

## 2022-08-19 MED ORDER — PHENYLEPHRINE 80 MCG/ML (10ML) SYRINGE FOR IV PUSH (FOR BLOOD PRESSURE SUPPORT)
PREFILLED_SYRINGE | INTRAVENOUS | Status: DC | PRN
  Administered 2022-08-19: 160 ug via INTRAVENOUS

## 2022-08-19 MED ORDER — LIDOCAINE HCL (PF) 2 % IJ SOLN
INTRAMUSCULAR | Status: AC
  Filled 2022-08-19: qty 10

## 2022-08-19 MED ORDER — LACTATED RINGERS IV SOLN: INTRAVENOUS | Status: DC | PRN

## 2022-08-19 MED ORDER — PROPOFOL 10 MG/ML IV BOLUS
INTRAVENOUS | Status: DC | PRN
Start: 2022-08-19 — End: 2022-08-19
  Administered 2022-08-19: 100 mg via INTRAVENOUS

## 2022-08-19 MED ORDER — PROPOFOL 500 MG/50ML IV EMUL
INTRAVENOUS | Status: DC | PRN
  Administered 2022-08-19: 150 ug/kg/min via INTRAVENOUS

## 2022-08-19 MED ORDER — PHENYLEPHRINE 80 MCG/ML (10ML) SYRINGE FOR IV PUSH (FOR BLOOD PRESSURE SUPPORT)
PREFILLED_SYRINGE | INTRAVENOUS | Status: AC
  Filled 2022-08-19: qty 10

## 2022-08-19 NOTE — Discharge Instructions (Signed)
  Colonoscopy Discharge Instructions  Read the instructions outlined below and refer to this sheet in the next few weeks. These discharge instructions provide you with general information on caring for yourself after you leave the hospital. Your doctor may also give you specific instructions. While your treatment has been planned according to the most current medical practices available, unavoidable complications occasionally occur. If you have any problems or questions after discharge, call Dr. Jena Gauss at [phone]. ACTIVITY You may resume your regular activity, but move at a slower pace for the next 24 hours.  Take frequent rest periods for the next 24 hours.  Walking will help get rid of the air and reduce the bloated feeling in your belly (abdomen).  No driving for 24 hours (because of the medicine (anesthesia) used during the test).   Do not sign any important legal documents or operate any machinery for 24 hours (because of the anesthesia used during the test).  NUTRITION Drink plenty of fluids.  You may resume your normal diet as instructed by your doctor.  Begin with a light meal and progress to your normal diet. Heavy or fried foods are harder to digest and may make you feel sick to your stomach (nauseated).  Avoid alcoholic beverages for 24 hours or as instructed.  MEDICATIONS You may resume your normal medications unless your doctor tells you otherwise.  WHAT YOU CAN EXPECT TODAY Some feelings of bloating in the abdomen.  Passage of more gas than usual.  Spotting of blood in your stool or on the toilet paper.  IF YOU HAD POLYPS REMOVED DURING THE COLONOSCOPY: No aspirin products for 7 days or as instructed.  No alcohol for 7 days or as instructed.  Eat a soft diet for the next 24 hours.  FINDING OUT THE RESULTS OF YOUR TEST Not all test results are available during your visit. If your test results are not back during the visit, make an appointment with your caregiver to find out the  results. Do not assume everything is normal if you have not heard from your caregiver or the medical facility. It is important for you to follow up on all of your test results.  SEEK IMMEDIATE MEDICAL ATTENTION IF: You have more than a spotting of blood in your stool.  Your belly is swollen (abdominal distention).  You are nauseated or vomiting.  You have a temperature over 101.  You have abdominal pain or discomfort that is severe or gets worse throughout the day.       1 polyp found today and removed   further recommendations to follow pending review of pathology report  At patient request, I called Becky at [phone] -  reviewed findings.

## 2022-08-19 NOTE — Op Note (Signed)
[facility] Patient Name: [name] Procedure Date: 08/19/2022 9:02 AM MRN: [id number] Date of Birth: 18-Feb-1960 Attending MD: Gennette Pac , MD, [id number] CSN: [id number] Age: 62 Admit Type: Outpatient Procedure:                Colonoscopy Indications:              Screening for colorectal malignant neoplasm Providers:                Gennette Pac, MD, Edrick Kins, RN,                            Zena Amos Referring MD:             Gennette Pac, MD Medicines:                Propofol per Anesthesia Complications:            No immediate complications. Estimated Blood Loss:     Estimated blood loss was minimal. Procedure:                Pre-Anesthesia Assessment:                           - Prior to the procedure, a History and Physical                            was performed, and patient medications and                            allergies were reviewed. The patient's tolerance of                            previous anesthesia was also reviewed. The risks                            and benefits of the procedure and the sedation                            options and risks were discussed with the patient.                            All questions were answered, and informed consent                            was obtained. Prior Anticoagulants: The patient has                            taken no anticoagulant or antiplatelet agents. ASA                            Grade Assessment: II - A patient with mild systemic                            disease. After reviewing the risks and benefits,  the patient was deemed in satisfactory condition to                            undergo the procedure.                           After obtaining informed consent, the colonoscope                            was passed under direct vision. Throughout the                            procedure, the patient's blood pressure, pulse, and                             oxygen saturations were monitored continuously. The                            [id number]) scope was introduced through the                            anus and advanced to the the cecum, identified by                            appendiceal orifice and ileocecal valve. The                            colonoscopy was performed without difficulty. The                            patient tolerated the procedure well. The quality                            of the bowel preparation was adequate. Anatomical                            landmarks were photographed. Scope In: 9:36:19 AM Scope Out: 9:47:55 AM Scope Withdrawal Time: 0 hours 6 minutes 42 seconds  Total Procedure Duration: 0 hours 11 minutes 36 seconds  Findings:      The perianal and digital rectal examinations were normal.      A 6 mm polyp was found in the mid ascending colon. The polyp was       sessile. The polyp was removed with a cold snare. Resection and       retrieval were complete. Estimated blood loss was minimal.      Non-bleeding internal hemorrhoids were found during retroflexion.      The exam was otherwise without abnormality on direct and retroflexion       views. Impression:               - One 6 mm polyp in the mid ascending colon,                            removed with a cold snare. Resected and retrieved.                           -  Non-bleeding internal hemorrhoids.                           - The examination was otherwise normal on direct                            and retroflexion views. Moderate Sedation:      Moderate (conscious) sedation was personally administered by an       anesthesia professional. The following parameters were monitored: oxygen       saturation, heart rate, blood pressure, respiratory rate, EKG, adequacy       of pulmonary ventilation, and response to care. Recommendation:           - Patient has a contact number available for                             emergencies. The signs and symptoms of potential                            delayed complications were discussed with the                            patient. Return to normal activities tomorrow.                            Written discharge instructions were provided to the                            patient.                           - Resume previous diet.                           - Repeat colonoscopy date to be determined after                            pending pathology results are reviewed for                            surveillance.                           - Return to GI office (date not yet determined). Procedure Code(s):        --- Professional ---                           [phone], Colonoscopy, flexible; with removal of                            tumor(s), polyp(s), or other lesion(s) by snare                            technique Diagnosis Code(s):        --- Professional ---  Z12.11, Encounter for screening for malignant                            neoplasm of colon                           D12.2, Benign neoplasm of ascending colon                           K64.8, Other hemorrhoids CPT copyright 2022 [employer]. All rights reserved. The codes documented in this report are preliminary and upon coder review may  be revised to meet current compliance requirements. Gerrit Friends. Shenandoah Yeats, MD Gennette Pac, MD 08/19/2022 9:56:45 AM This report has been signed electronically. Number of Addenda: 0

## 2022-08-19 NOTE — Anesthesia Preprocedure Evaluation (Signed)
Anesthesia Evaluation  Patient identified by MRN, date of birth, ID band Patient awake    Reviewed: Allergy & Precautions, H&P , NPO status , Patient's Chart, lab work & pertinent test results, reviewed documented beta blocker date and time   Airway Mallampati: II  TM Distance: >3 FB Neck ROM: full    Dental no notable dental hx.    Pulmonary neg pulmonary ROS   Pulmonary exam normal breath sounds clear to auscultation       Cardiovascular Exercise Tolerance: Good hypertension, negative cardio ROS  Rhythm:regular Rate:Normal     Neuro/Psych   Anxiety     negative neurological ROS  negative psych ROS   GI/Hepatic negative GI ROS, Neg liver ROS,GERD  ,,  Endo/Other  negative endocrine ROS    Renal/GU negative Renal ROS  negative genitourinary   Musculoskeletal   Abdominal   Peds  Hematology negative hematology ROS (+) Blood dyscrasia, anemia   Anesthesia Other Findings   Reproductive/Obstetrics negative OB ROS                             Anesthesia Physical Anesthesia Plan  ASA: 2  Anesthesia Plan: General   Post-op Pain Management:    Induction:   PONV Risk Score and Plan: Propofol infusion  Airway Management Planned:   Additional Equipment:   Intra-op Plan:   Post-operative Plan:   Informed Consent: I have reviewed the patients History and Physical, chart, labs and discussed the procedure including the risks, benefits and alternatives for the proposed anesthesia with the patient or authorized representative who has indicated his/her understanding and acceptance.     Dental Advisory Given  Plan Discussed with: CRNA  Anesthesia Plan Comments:        Anesthesia Quick Evaluation

## 2022-08-19 NOTE — H&P (Signed)
@LOGO @   Primary Care Physician:  Benita Stabile, MD Primary Gastroenterologist:  Dr. Jena Gauss  Pre-Procedure History & Physical: HPI:  [name] is a 62 y.o. female here for  average risk screening colonoscopy.  No bowel symptoms.  No family history of colon cancer.    Normal colonoscopy   2014.    No bowel symptoms.  Past Medical History:  Diagnosis Date   Anemia    Anxiety    Arthritis    Essential hypertension    GERD (gastroesophageal reflux disease)    Lumbago    Mixed hyperlipidemia     Past Surgical History:  Procedure Laterality Date   ABDOMINAL HYSTERECTOMY  1991-1992   [facility]   CARPAL TUNNEL RELEASE Right 10/26/2016   Procedure: RIGHT CARPAL TUNNEL RELEASE;  Surgeon: Cindee Salt, MD;  Location: [facility];  Service: Orthopedics;  Laterality: Right;   CATARACT EXTRACTION W/PHACO Right 07/19/2022   Procedure: CATARACT EXTRACTION PHACO AND INTRAOCULAR LENS PLACEMENT (IOC);  Surgeon: [name], MD;  Location: [facility];  Service: Ophthalmology;  Laterality: Right;  CDE: 5.86   COLONOSCOPY N/A 07/13/2012   Procedure: COLONOSCOPY;  Surgeon: Corbin Ade, MD;  Location: [facility];  Service: Endoscopy;  Laterality: N/A;  2:30 PM-moved to [address] to notify pt   CYSTOSCOPY WITH RETROGRADE PYELOGRAM, URETEROSCOPY AND STENT PLACEMENT Bilateral 05/27/2022   Procedure: CYSTOSCOPY WITH RETROGRADE PYELOGRAM, DIAGNOSTIC URETEROSCOPY AND STENT PLACEMENT;  Surgeon: Malen Gauze, MD;  Location: [facility];  Service: Urology;  Laterality: Bilateral;   TRIGGER FINGER RELEASE Right 10/26/2016   Procedure: RELEASE TRIGGER FINGER/A-1 PULLEY RIGHT MIDDLE FINGER;  Surgeon: Cindee Salt, MD;  Location: [facility];  Service: Orthopedics;  Laterality: Right;   WISDOM TOOTH EXTRACTION      Prior to Admission medications   Medication Sig Start Date End Date Taking? Authorizing Provider  Biotin 1000 MCG tablet Take 1,000 mcg by mouth every evening.   Yes  [provider]  Cholecalciferol (VITAMIN D3) 50 MCG (2000 UT) CAPS Take 2,000 Units by mouth in the morning and at bedtime.   Yes [provider]  Cyanocobalamin (VITAMIN B-12 PO) Take 1 tablet by mouth every evening.   Yes [provider]  ferrous sulfate 325 (65 FE) MG tablet Take 325 mg by mouth every evening.   Yes [provider]  LYSINE PO Take 1 tablet by mouth every evening.   Yes [provider]  MAGNESIUM PO Take 1 tablet by mouth every evening.   Yes [provider]  metoprolol succinate (TOPROL-XL) 25 MG 24 hr tablet Take 25 mg by mouth in the morning. 08/16/16  Yes [provider]  Multiple Vitamin (MULTIVITAMIN WITH MINERALS) TABS tablet Take 1 tablet by mouth every evening.   Yes [provider]  naproxen sodium (ALEVE) 220 MG tablet Take 220 mg by mouth in the morning.   Yes [provider]  OMEGA-3 FATTY ACIDS PO Take 1 capsule by mouth every evening.   Yes [provider]  rosuvastatin (CRESTOR) 20 MG tablet Take 20 mg by mouth in the morning.   Yes [provider]  Turmeric 450 MG CAPS Take 1 capsule by mouth every evening.   Yes [provider]  tamsulosin (FLOMAX) 0.4 MG CAPS capsule TAKE 1 CAPSULE(0.4 MG) BY MOUTH DAILY Patient not taking: Reported on 08/17/2022 07/03/22   Malen Gauze, MD    Allergies as of 07/20/2022   (No Known Allergies)  Family History  Problem Relation Age of Onset   Prostate cancer Father    Heart disease Father    Diabetes Maternal Grandmother    Diabetes Paternal Grandmother    Breast cancer Paternal Aunt    Colon cancer Neg Hx     Social History   Socioeconomic History   Marital status: Married    Spouse name: Not on file   Number of children: Not on file   Years of education: Not on file   Highest education level: Not on file  Occupational History   Not on file  Tobacco Use   Smoking status: Never   Smokeless  tobacco: Never  Vaping Use   Vaping status: Never Used  Substance and Sexual Activity   Alcohol use: Yes    [place]    Drug use: No   Sexual activity: Yes    Birth control/protection: Surgical  Other Topics Concern   Not on file  Social History Narrative   Not on file   Social Determinants of Health   Financial Resource Strain: Not on file  Food Insecurity: Not on file  Transportation Needs: Not on file  Physical Activity: Not on file  Stress: Not on file  Social Connections: Not on file  Intimate Partner Violence: Not on file    Review of Systems: See HPI, otherwise negative ROS  Physical Exam: BP 108/76   Pulse 61   Temp 98 F (36.7 C) (Oral)   Resp 12   SpO2 100%  General:   Alert,  Well-developed, well-nourished, pleasant and cooperative in NAD Skin:  Intact without significant lesions or rashes. Eyes:  Sclera clear, no icterus.   Conjunctiva pink. Ears:  Normal auditory acuity. Nose:  No deformity, discharge,  or lesions. Mouth:  No deformity or lesions. Neck:  Supple; no masses or thyromegaly. No significant cervical adenopathy. Lungs:  Clear throughout to auscultation.   No wheezes, crackles, or rhonchi. No acute distress. Heart:  Regular rate and rhythm; no murmurs, clicks, rubs,  or gallops. Abdomen: Non-distended, normal bowel sounds.  Soft and nontender without appreciable mass or hepatosplenomegaly.  Pulses:  Normal pulses noted. Extremities:  Without clubbing or edema.  Impression/Plan:    62 year old lady here for average risk screening colonoscopy. The risks, benefits, limitations, alternatives and imponderables have been reviewed with the patient. Questions have been answered. All parties are agreeable.       Notice: This dictation was prepared with Dragon dictation along with smaller phrase technology. Any transcriptional errors that result from this process are unintentional and may not be corrected upon review.

## 2022-08-19 NOTE — Transfer of Care (Signed)
Immediate Anesthesia Transfer of Care Note  Patient: [name]  Procedure(s) Performed: COLONOSCOPY WITH PROPOFOL POLYPECTOMY  Patient Location: PACU  Anesthesia Type:General  Level of Consciousness: awake and patient cooperative  Airway & Oxygen Therapy: Patient Spontanous Breathing  Post-op Assessment: Report given to RN and Post -op Vital signs reviewed and stable  Post vital signs: Reviewed and stable  Last Vitals:  Vitals Value Taken Time  BP 111/60 08/19/22 0956  Temp 36.4 C 08/19/22 0956  Pulse 58 08/19/22 0956  Resp 17 08/19/22 0956  SpO2 99 % 08/19/22 0956    Last Pain:  Vitals:   08/19/22 0956  TempSrc: Oral  PainSc: 0-No pain      Patients Stated Pain Goal: 7 (08/19/22 0742)  Complications: No notable events documented.

## 2022-08-23 ENCOUNTER — Encounter: Payer: Self-pay | Admitting: Internal Medicine

## 2022-08-24 ENCOUNTER — Encounter (HOSPITAL_COMMUNITY): Payer: Self-pay | Admitting: Internal Medicine

## 2022-08-24 NOTE — Progress Notes (Unsigned)
Name: [name] DOB: [birth year] MRN: [id number]  History of Present Illness: [name] is a 62 y.o. female who presents today for follow up visit at [facility].  Recent history:  - 05/27/2022: Underwent diagnostic cystoscopy, bilateral ureteroscopy, bilateral retrograde pyelography, and bilateral ureteral stent placement by Dr. Ronne Gibbs for gross hematuria evaluation. Findings consistent with recent stone passage.  - 06/09/2022: Postop visit with Dr. Ronne Gibbs; ureteral stent removed via cystoscopy. The plan was follow up in 6 weeks with renal US.   Since last visit: RUS on 07/28/2022 showed: 1. Left nonobstructive nephrolithiasis, measuring up to 3 mm. There is also a possible right renal 3 mm calculus. 2. No hydronephrosis.  Today: She reports doing well. She denies increased urinary urgency, frequency, nocturia, dysuria, gross hematuria, hesitancy, straining to void, or sensations of incomplete emptying. She denies flank or abdominal pain.   Fall Screening: Do you usually have a device to assist in your mobility? No   Medications: Current Outpatient Medications  Medication Sig Dispense Refill   Biotin 1000 MCG tablet Take 1,000 mcg by mouth every evening.     Cholecalciferol (VITAMIN D3) 50 MCG (2000 UT) CAPS Take 2,000 Units by mouth in the morning and at bedtime.     Cyanocobalamin (VITAMIN B-12 PO) Take 1 tablet by mouth every evening.     ferrous sulfate 325 (65 FE) MG tablet Take 325 mg by mouth every evening.     LYSINE PO Take 1 tablet by mouth every evening.     MAGNESIUM PO Take 1 tablet by mouth every evening.     metoprolol succinate (TOPROL-XL) 25 MG 24 hr tablet Take 25 mg by mouth in the morning.     Multiple Vitamin (MULTIVITAMIN WITH MINERALS) TABS tablet Take 1 tablet by mouth every evening.     naproxen sodium (ALEVE) 220 MG tablet Take 220 mg by mouth in the morning.     OMEGA-3 FATTY ACIDS PO Take 1 capsule by mouth every evening.      rosuvastatin (CRESTOR) 20 MG tablet Take 20 mg by mouth in the morning.     tamsulosin (FLOMAX) 0.4 MG CAPS capsule TAKE 1 CAPSULE(0.4 MG) BY MOUTH DAILY 30 capsule 0   Turmeric 450 MG CAPS Take 1 capsule by mouth every evening.     No current facility-administered medications for this visit.    Allergies: No Known Allergies  Past Medical History:  Diagnosis Date   Anemia    Anxiety    Arthritis    Essential hypertension    GERD (gastroesophageal reflux disease)    Lumbago    Mixed hyperlipidemia    Past Surgical History:  Procedure Laterality Date   ABDOMINAL HYSTERECTOMY  1991-1992   [facility]   CARPAL TUNNEL RELEASE Right 10/26/2016   Procedure: RIGHT CARPAL TUNNEL RELEASE;  Surgeon: Sharon Salt, MD;  Location: [facility];  Service: Orthopedics;  Laterality: Right;   CATARACT EXTRACTION W/PHACO Right 07/19/2022   Procedure: CATARACT EXTRACTION PHACO AND INTRAOCULAR LENS PLACEMENT (IOC);  Surgeon: Sharon Pierce, MD;  Location: [facility];  Service: Ophthalmology;  Laterality: Right;  CDE: 5.86   COLONOSCOPY N/A 07/13/2012   Procedure: COLONOSCOPY;  Surgeon: Sharon Ade, MD;  Location: [facility];  Service: Endoscopy;  Laterality: N/A;  2:30 PM-moved to [address] to notify pt   COLONOSCOPY WITH PROPOFOL N/A 08/19/2022   Procedure: COLONOSCOPY WITH PROPOFOL;  Surgeon: Sharon Ade, MD;  Location: [facility];  Service:  Endoscopy;  Laterality: N/A;  8:45 AM, ASA 2   CYSTOSCOPY WITH RETROGRADE PYELOGRAM, URETEROSCOPY AND STENT PLACEMENT Bilateral 05/27/2022   Procedure: CYSTOSCOPY WITH RETROGRADE PYELOGRAM, DIAGNOSTIC URETEROSCOPY AND STENT PLACEMENT;  Surgeon: Sharon Gauze, MD;  Location: [facility];  Service: Urology;  Laterality: Bilateral;   POLYPECTOMY  08/19/2022   Procedure: POLYPECTOMY;  Surgeon: Sharon Ade, MD;  Location: [facility];  Service: Endoscopy;;   TRIGGER FINGER RELEASE Right 10/26/2016   Procedure: RELEASE TRIGGER FINGER/A-1 PULLEY RIGHT  MIDDLE FINGER;  Surgeon: Sharon Salt, MD;  Location:  SURGERY CENTER;  Service: Orthopedics;  Laterality: Right;   WISDOM TOOTH EXTRACTION     Family History  Problem Relation Age of Onset   Prostate cancer Father    Heart disease Father    Diabetes Maternal Grandmother    Diabetes Paternal Grandmother    Breast cancer Paternal Aunt    Colon cancer Neg Hx    Social History   Socioeconomic History   Marital status: Married    Spouse name: Not on file   Number of children: Not on file   Years of education: Not on file   Highest education level: Not on file  Occupational History   Not on file  Tobacco Use   Smoking status: Never   Smokeless tobacco: Never  Vaping Use   Vaping status: Never Used  Substance and Sexual Activity   Alcohol use: Yes    [place]    Drug use: No   Sexual activity: Yes    Birth control/protection: Surgical  Other Topics Concern   Not on file  Social History Narrative   Not on file   Social Determinants of Health   Financial Resource Strain: Not on file  Food Insecurity: Not on file  Transportation Needs: Not on file  Physical Activity: Not on file  Stress: Not on file  Social Connections: Not on file  Intimate Partner Violence: Not on file    Review of Systems Constitutional: Patient denies any unintentional weight loss or change in strength lntegumentary: Patient denies any rashes or pruritus Eyes: Patient denies dry eyes ENT: Patient denies dry mouth Cardiovascular: Patient denies chest pain or syncope Respiratory: Patient denies shortness of breath Gastrointestinal: Patient denies nausea, vomiting, constipation, or diarrhea Musculoskeletal: Patient denies muscle cramps or weakness Neurologic: Patient denies convulsions or seizures Psychiatric: Patient denies memory problems Allergic/Immunologic: Patient denies recent allergic reaction(s) Hematologic/Lymphatic: Patient denies bleeding  tendencies Endocrine: Patient denies heat/cold intolerance  GU: As per HPI.  OBJECTIVE Vitals:   08/26/22 0856  BP: 106/72  Pulse: 66  Temp: 98.4 F (36.9 C)   There is no height or weight on file to calculate BMI.  Physical Examination  Constitutional: No obvious distress; patient is non-toxic appearing  Cardiovascular: No visible lower extremity edema.  Respiratory: The patient does not have audible wheezing/stridor; respirations do not appear labored  Gastrointestinal: Abdomen non-distended Musculoskeletal: Normal ROM of UEs  Skin: No obvious rashes/open sores  Neurologic: CN 2-12 grossly intact Psychiatric: Answered questions appropriately with normal affect  Hematologic/Lymphatic/Immunologic: No obvious bruises or sites of spontaneous bleeding  UA: negative for   ASSESSMENT Kidney stone - Plan: Urinalysis, Routine w reflex microscopic, DG Abd 1 View  We reviewed recent imaging results; no acute findings. Advised adequate hydration and we discussed option to consider low oxalate diet given that calcium oxalate is the most common type of stone. Handout provided about stone prevention diet.  Will plan  to follow up in 6 months with KUB for stone surveillance or sooner if needed. Pt verbalized understanding and agreement. All questions were answered.  PLAN Advised the following: 1. Adequate fluid intake (2-2.5 liters per day). 2. Drink citrus juice (lemon, lime or orange juice). 3. Low oxalate diet. 4. Return in about 6 months (around 02/26/2023) for KUB, UA, & f/u with Evette Georges NP.  Orders Placed This Encounter  Procedures   DG Abd 1 View    Standing Status:   Future    Standing Expiration Date:   08/26/2023    Order Specific Question:   Reason for Exam (SYMPTOM  OR DIAGNOSIS REQUIRED)    Answer:   kidney stone    Order Specific Question:   Preferred imaging location?    Answer:   [facility]   Urinalysis, Routine w reflex microscopic    It has been  explained that the patient is to follow regularly with their PCP in addition to all other providers involved in their care and to follow instructions provided by these respective offices. Patient advised to contact urology clinic if any urologic-pertaining questions, concerns, new symptoms or problems arise in the interim period.  Patient Instructions  >80% of stones are calcium oxalate. This type of stones forms when body either isn't clearing oxalate well enough, is making too much oxalate, or too little citrate. This results in oxalate binding to form crystals, which continue to aggregate and form stones.  Limiting calcium does not help, but limiting oxalate in the diet can help. Increasing citric acid intake may also help.  The following measures may help to prevent the recurrence of stones: Increase water intake to 2-2.5 liters per day May add citrus juice (lemon, lime or orange juice) to water Moderation in dairy foods Decrease in Gibbs content Low Oxalate diet: Oxylates are found in foods like Tomato, Spinach, red wine and chocolate (see additional resources below).  Internet resources for information regarding low oxalate diet:  https://kidneystones.yangchunwu.com https://my.VerticalStretch.be  Foods Low in Sodium or Oxalate Foods You Can Eat  Drinks Coffee, fruit and veggie juice (using the recommended veggies), fruit punch  Fruits Apples, apricots (fresh or canned), avocado, bananas, cherries (sweet), cranberries, grapefruit, red or [place] grapes, lemon and lime juice, melons, nectarines, papayas, peaches, pears, pineapples, oranges, strawberries (fresh), tangerines  Veggies Artichokes, asparagus, bamboo shoots, broccoli, brussels sprouts, cabbage, cauliflower, chayote squash, chicory, corn, cucumbers, endive, lettuce, lima beans, mushrooms, onions, peas, peppers, potatoes, radishes, rutabagas,  zucchini  Breads, Cereals, Grains Egg noodles, rye bread, cooked and dry cereals without nuts or bran, crackers with unsalted tops, white or wild rice  Meat, Meat Replacements, Fish, Recruitment consultant, fish, poultry, eggs, egg whites, egg replacements  Soup Homemade soup (using the recommended veggies and meat), low-sodium bouillon, low-sodium canned  Desserts Cookies, cakes, ice cream, pudding without chocolate or nuts, candy without chocolate or nuts  Fats and Oils Butter, margarine, cream, oil, salad dressing, mayo  Other Foods Unsalted potato chips or pretzels, herbs (like garlic, garlic powder, onion powder), lemon juice, Gibbs-free seasoning blends, vinegar  Other Foods Low in Oxalate Foods You Can Eat  Drinks Beer, cola, wine, buttermilk, lemonade or limeade (without added vitamin C), milk  Meat, Meat Replacements, Fish, [employer] meat, ham, bacon, hot dogs, bratwurst, sausage, chicken nuggets, cheddar cheese, canned fish and shellfish  Soup Tomato soup, cheese soup  Other Foods Coconuts, lemon or lime juices, sugar or sweeteners, jellies or jams (from the recommended list)   Moderate-Oxalate  Foods Foods to Limit   Drinks Fruit and veggie juices (from the list below), chocolate milk, rice milk, hot cocoa, tea   Fruits Blackberries, blueberries, black currants, cherries (sour), fruit cocktail, mangoes, orange peel, prunes, purple plums   Veggies Baked beans, carrots, celery, Caraveo beans, parsnips, summer squash, tomatoes, turnips   Breads, Cereals, Grains White bread, cornbread or cornmeal, white English muffins, saltine or soda crackers, brown rice, vanilla wafers, spaghetti and other noodles, firm tofu, bagels, oatmeal   Meat/meat replacements, fish, poultry Sardines   Desserts Chocolate cake   Fats and Oils Macadamia nuts, pistachio nuts, English walnuts   Other Foods Jams or jellies (made with the fruits above), pepper   High-Oxalate Foods Foods to Avoid Drinks Chocolate drink  mixes, soy milk, Ovaltine, instant iced tea, fruit juices of fruits listed below Fruits Apricots (dried), red currants, figs, kiwi, plums, rhubarb Veggies Beans (wax, dried), beets and beet greens, chives, collard greens, eggplant, escarole, dark greens of all kinds, leeks, okra, parsley, rutabagas, spinach, Swiss chard, tomato paste, watercress Breads, Cereals, Grains Amaranth, barley, white corn flour, fried potatoes, fruitcake, grits, soybean products, sweet potatoes, wheat germ and bran, buckwheat flour, All Bran cereal, graham crackers, pretzels, whole wheat bread Meat/meat replacements, fish, poultry Dried beans, peanut butter, soy burgers, miso Desserts Carob, chocolate, marmalades Fats and Oils Nuts (peanuts, almonds, pecans, cashews, hazelnuts), nut butters, sesame seeds, tahini paste Other Foods Poppy seeds   Electronically signed by:  Donnita Falls, FNP   08/26/22    9:54 AM

## 2022-08-24 NOTE — Anesthesia Postprocedure Evaluation (Signed)
Anesthesia Post Note  Patient: [name]  Procedure(s) Performed: COLONOSCOPY WITH PROPOFOL POLYPECTOMY  Patient location during evaluation: Phase II Anesthesia Type: General Level of consciousness: awake Pain management: pain level controlled Vital Signs Assessment: post-procedure vital signs reviewed and stable Respiratory status: spontaneous breathing and respiratory function stable Cardiovascular status: blood pressure returned to baseline and stable Postop Assessment: no headache and no apparent nausea or vomiting Anesthetic complications: no Comments: Late entry   No notable events documented.   Last Vitals:  Vitals:   08/19/22 0742 08/19/22 0956  BP: 108/76 111/60  Pulse: 61 (!) 58  Resp: 12 17  Temp: 36.7 C 36.4 C  SpO2: 100% 99%    Last Pain:  Vitals:   08/19/22 0956  TempSrc: Oral  PainSc: 0-No pain                 Windell Norfolk

## 2022-08-26 ENCOUNTER — Encounter: Payer: Self-pay | Admitting: Urology

## 2022-08-26 ENCOUNTER — Ambulatory Visit (INDEPENDENT_AMBULATORY_CARE_PROVIDER_SITE_OTHER): Payer: Managed Care, Other (non HMO) | Admitting: Urology

## 2022-08-26 VITALS — BP 106/72 | HR 66 | Temp 98.4°F

## 2022-08-26 DIAGNOSIS — N200 Calculus of kidney: Secondary | ICD-10-CM | POA: Diagnosis not present

## 2022-08-26 LAB — URINALYSIS, ROUTINE W REFLEX MICROSCOPIC
Bilirubin, UA: NEGATIVE
Glucose, UA: NEGATIVE
Ketones, UA: NEGATIVE
Nitrite, UA: NEGATIVE
Protein,UA: NEGATIVE
Specific Gravity, UA: 1.03 (ref 1.005–1.030)
Urobilinogen, Ur: 0.2 mg/dL (ref 0.2–1.0)
pH, UA: 6 (ref 5.0–7.5)

## 2022-08-26 LAB — MICROSCOPIC EXAMINATION: Bacteria, UA: NONE SEEN

## 2022-08-26 NOTE — Patient Instructions (Signed)

## 2022-09-15 ENCOUNTER — Encounter (HOSPITAL_COMMUNITY): Payer: Self-pay

## 2022-09-15 ENCOUNTER — Encounter (HOSPITAL_COMMUNITY)
Admission: RE | Admit: 2022-09-15 | Discharge: 2022-09-15 | Disposition: A | Discharge status: Home / Self Care | Payer: Managed Care, Other (non HMO) | Source: Ambulatory Visit | Attending: Ophthalmology | Admitting: Ophthalmology

## 2022-09-15 ENCOUNTER — Other Ambulatory Visit: Payer: Self-pay

## 2022-09-15 NOTE — H&P (Signed)
Surgical History & Physical  Patient Name: [name]  DOB: 1960-02-02  Surgery: Cataract extraction with intraocular lens implant phacoemulsification; Left Eye Surgeon: [name] MD Surgery Date: 09/20/2022 Pre-Op Date: 08/05/2022  HPI: A 50 Yr. old female patient 1. The patient is returning after cataract post-op. The right eye is affected. Status post cataract post-op, which began 2 weeks ago: Since the last visit, the affected area is doing well. The patient's vision is stable. Patient is following medication instructions. The patient complains of difficulty when driving due to glare from headlights or sun, reading fine print like on medicine bottles, which has progressed over the past 2-3 months ago in left eye. The episode is constant. Patient is having significant trouble getting eyes to focus together and sometimes has to shut one eye to help. This is negatively affecting the patient's quality of life and the patient is unable to function adequately in life with the current level of vision. HPI was performed by [name] .  Medical History: Cataracts  Heart Problem LDL  Review of Systems Negative Allergic/Immunologic Cardiovascular heart problems Negative Constitutional Negative Ear, Nose, Mouth & Throat Negative Endocrine Eyes cataract Negative Gastrointestinal Negative Genitourinary Negative Hemotologic/Lymphatic Negative Integumentary Negative Musculoskeletal Negative Neurological Negative Psychiatry Negative Respiratory  Social Never smoked   Medication Prednisolone-Moxifloxacin-Bromfenac,  Rosuvastatin, Metoprolol  Sx/Procedures Phaco c IOL OD,  Kidney stones with stent, Carpal Tunnel - right, Hysterectomy  Drug Allergies  NKDA  History & Physical: Heent: cataract NECK: supple without bruits LUNGS: lungs clear to auscultation CV: regular rate and rhythm Abdomen: soft and non-tender  Impression & Plan: Assessment: 1.  CATARACT EXTRACTION  STATUS; Right Eye (Z98.41) 2.  INTRAOCULAR LENS IOL ; Right Eye (Z96.1) 3.  NUCLEAR SCLEROSIS AGE RELATED; Left Eye (H25.12)  Plan: 1.  17 days after cataract surgery. Doing well with improved vision and normal eye pressure. Call with any problems or concerns. Continue Pred-Moxi-Brom 2x/day for 2 more weeks.  2.  Doing well since surgery Continue Post-op medications  3.  Cataract accounts for the patient's decreased vision. This visual impairment is not correctable with a tolerable change in glasses or contact lenses. Cataract surgery with an implantation of a new lens should significantly improve the visual and functional status of the patient. Discussed all risks, benefits, alternatives, and potential complications. Discussed the procedures and recovery. Patient desires to have surgery. A-scan ordered and performed today for intra-ocular lens calculations. The surgery will be performed in order to improve vision for driving, reading, and for eye examinations. Recommend phacoemulsification with intra-ocular lens. Recommend Dextenza for post-operative pain and inflammation. Left Eye. Surgery required to correct imbalance of vision. Dilates well - shugarcaine by protocol.

## 2022-09-20 ENCOUNTER — Ambulatory Visit (HOSPITAL_COMMUNITY)
Admission: RE | Admit: 2022-09-20 | Discharge: 2022-09-20 | Disposition: A | Discharge status: Home / Self Care | Payer: Managed Care, Other (non HMO) | Attending: Ophthalmology | Admitting: Ophthalmology

## 2022-09-20 ENCOUNTER — Ambulatory Visit (HOSPITAL_BASED_OUTPATIENT_CLINIC_OR_DEPARTMENT_OTHER): Payer: Managed Care, Other (non HMO) | Admitting: Anesthesiology

## 2022-09-20 ENCOUNTER — Ambulatory Visit (HOSPITAL_COMMUNITY): Payer: Managed Care, Other (non HMO) | Admitting: Anesthesiology

## 2022-09-20 ENCOUNTER — Encounter (HOSPITAL_COMMUNITY)
Admission: RE | Disposition: A | Discharge status: Home / Self Care | Payer: Self-pay | Source: Home / Self Care | Attending: Ophthalmology

## 2022-09-20 DIAGNOSIS — I10 Essential (primary) hypertension: Secondary | ICD-10-CM | POA: Diagnosis not present

## 2022-09-20 DIAGNOSIS — G709 Myoneural disorder, unspecified: Secondary | ICD-10-CM | POA: Insufficient documentation

## 2022-09-20 DIAGNOSIS — H25812 Combined forms of age-related cataract, left eye: Secondary | ICD-10-CM | POA: Insufficient documentation

## 2022-09-20 DIAGNOSIS — Z961 Presence of intraocular lens: Secondary | ICD-10-CM | POA: Insufficient documentation

## 2022-09-20 DIAGNOSIS — Z9841 Cataract extraction status, right eye: Secondary | ICD-10-CM | POA: Diagnosis not present

## 2022-09-20 HISTORY — PX: CATARACT EXTRACTION W/PHACO: SHX586

## 2022-09-20 SURGERY — PHACOEMULSIFICATION, CATARACT, WITH IOL INSERTION
Anesthesia: Monitor Anesthesia Care | Site: Eye | Laterality: Left

## 2022-09-20 MED ORDER — STERILE WATER FOR IRRIGATION IR SOLN
Status: DC | PRN
  Administered 2022-09-20: 250 mL

## 2022-09-20 MED ORDER — SODIUM HYALURONATE 23MG/ML IO SOSY
PREFILLED_SYRINGE | INTRAOCULAR | Status: DC | PRN
  Administered 2022-09-20: .6 mL via INTRAOCULAR

## 2022-09-20 MED ORDER — SODIUM CHLORIDE 0.9% FLUSH
INTRAVENOUS | Status: DC | PRN
  Administered 2022-09-20: 3 mL via INTRAVENOUS

## 2022-09-20 MED ORDER — SODIUM HYALURONATE 10 MG/ML IO SOLUTION
PREFILLED_SYRINGE | INTRAOCULAR | Status: DC | PRN
  Administered 2022-09-20: .85 mL via INTRAOCULAR

## 2022-09-20 MED ORDER — LIDOCAINE HCL (PF) 1 % IJ SOLN
INTRAOCULAR | Status: DC | PRN
  Administered 2022-09-20: 1 mL via OPHTHALMIC

## 2022-09-20 MED ORDER — EPINEPHRINE PF 1 MG/ML IJ SOLN
INTRAOCULAR | Status: DC | PRN
  Administered 2022-09-20: 500 mL

## 2022-09-20 MED ORDER — POVIDONE-IODINE 5 % OP SOLN
OPHTHALMIC | Status: DC | PRN
  Administered 2022-09-20: 1 via OPHTHALMIC

## 2022-09-20 MED ORDER — MIDAZOLAM HCL 2 MG/2ML IJ SOLN
INTRAMUSCULAR | Status: AC
  Filled 2022-09-20: qty 2

## 2022-09-20 MED ORDER — MIDAZOLAM HCL 2 MG/2ML IJ SOLN
INTRAMUSCULAR | Status: DC | PRN
  Administered 2022-09-20: 2 mg via INTRAVENOUS

## 2022-09-20 MED ORDER — BSS IO SOLN
INTRAOCULAR | Status: DC | PRN
  Administered 2022-09-20: 15 mL via INTRAOCULAR

## 2022-09-20 MED ORDER — LACTATED RINGERS IV SOLN: INTRAVENOUS | Status: DC

## 2022-09-20 MED ORDER — TROPICAMIDE 1 % OP SOLN
1.0000 [drp] | OPHTHALMIC | Status: AC | PRN
  Administered 2022-09-20 (×3): 1 [drp] via OPHTHALMIC

## 2022-09-20 MED ORDER — MOXIFLOXACIN 0.1% OPHTHALMIC SOLUTION (0.1ML)
INJECTION | OPHTHALMIC | Status: DC | PRN
  Administered 2022-09-20: .3 mL via OPHTHALMIC

## 2022-09-20 MED ORDER — TETRACAINE HCL 0.5 % OP SOLN
1.0000 [drp] | OPHTHALMIC | Status: AC | PRN
  Administered 2022-09-20 (×3): 1 [drp] via OPHTHALMIC

## 2022-09-20 MED ORDER — LIDOCAINE HCL 3.5 % OP GEL
1.0000 | Freq: Once | OPHTHALMIC | Status: AC
  Administered 2022-09-20: 1 via OPHTHALMIC

## 2022-09-20 MED ORDER — PHENYLEPHRINE HCL 2.5 % OP SOLN
1.0000 [drp] | OPHTHALMIC | Status: AC | PRN
  Administered 2022-09-20 (×3): 1 [drp] via OPHTHALMIC

## 2022-09-20 SURGICAL SUPPLY — 13 items
CATARACT SUITE SIGHTPATH (MISCELLANEOUS) ×1 IMPLANT
CLOTH BEACON ORANGE TIMEOUT ST (SAFETY) ×2 IMPLANT
EYE SHIELD UNIVERSAL CLEAR (GAUZE/BANDAGES/DRESSINGS) IMPLANT
FEE CATARACT SUITE SIGHTPATH (MISCELLANEOUS) ×2 IMPLANT
GLOVE BIOGEL PI IND STRL 7.0 (GLOVE) ×4 IMPLANT
LENS IOL TECNIS EYHANCE 23.5 (Intraocular Lens) IMPLANT
NDL HYPO 18GX1.5 BLUNT FILL (NEEDLE) ×2 IMPLANT
NEEDLE HYPO 18GX1.5 BLUNT FILL (NEEDLE) ×1 IMPLANT
PAD ARMBOARD 7.5X6 YLW CONV (MISCELLANEOUS) ×2 IMPLANT
POSITIONER HEAD 8X9X4 ADT (SOFTGOODS) ×2 IMPLANT
SYR TB 1ML LL NO SAFETY (SYRINGE) ×2 IMPLANT
TAPE SURG TRANSPORE 1 IN (GAUZE/BANDAGES/DRESSINGS) IMPLANT
WATER STERILE IRR 250ML POUR (IV SOLUTION) ×2 IMPLANT

## 2022-09-20 NOTE — Anesthesia Procedure Notes (Signed)
Date/Time: 09/20/2022 11:11 AM  Performed by: Franco Nones, CRNAPre-anesthesia Checklist: Patient identified, Emergency Drugs available, Suction available, Timeout performed and Patient being monitored Patient Re-evaluated:Patient Re-evaluated prior to induction Oxygen Delivery Method: Nasal Cannula

## 2022-09-20 NOTE — Anesthesia Preprocedure Evaluation (Signed)
Anesthesia Evaluation  Patient identified by MRN, date of birth, ID band Patient awake    Reviewed: Allergy & Precautions, H&P , NPO status , Patient's Chart, lab work & pertinent test results, reviewed documented beta blocker date and time   Airway Mallampati: II  TM Distance: >3 FB Neck ROM: full    Dental no notable dental hx.    Pulmonary neg pulmonary ROS   Pulmonary exam normal breath sounds clear to auscultation       Cardiovascular Exercise Tolerance: Good hypertension, negative cardio ROS  Rhythm:regular Rate:Normal     Neuro/Psych  PSYCHIATRIC DISORDERS Anxiety      Neuromuscular disease negative neurological ROS  negative psych ROS   GI/Hepatic negative GI ROS, Neg liver ROS,GERD  ,,  Endo/Other  negative endocrine ROS    Renal/GU Renal diseasenegative Renal ROS  negative genitourinary   Musculoskeletal   Abdominal   Peds  Hematology negative hematology ROS (+) Blood dyscrasia, anemia   Anesthesia Other Findings   Reproductive/Obstetrics negative OB ROS                             Anesthesia Physical Anesthesia Plan  ASA: 2  Anesthesia Plan: MAC   Post-op Pain Management:    Induction:   PONV Risk Score and Plan:   Airway Management Planned:   Additional Equipment:   Intra-op Plan:   Post-operative Plan:   Informed Consent: I have reviewed the patients History and Physical, chart, labs and discussed the procedure including the risks, benefits and alternatives for the proposed anesthesia with the patient or authorized representative who has indicated his/her understanding and acceptance.     Dental Advisory Given  Plan Discussed with: CRNA  Anesthesia Plan Comments:        Anesthesia Quick Evaluation

## 2022-09-20 NOTE — Discharge Instructions (Addendum)
Please discharge patient when stable, will follow up today with Dr. Wrzosek at the [facility] office immediately following discharge.  Leave shield in place until visit.  All paperwork with discharge instructions will be given at the office.  [facility] Address:  [address]  [place], [place] [address]  

## 2022-09-20 NOTE — Op Note (Signed)
Date of procedure: 09/20/22  Pre-operative diagnosis: Visually significant age-related combined cataract, Left Eye (H25.812)  Post-operative diagnosis: Visually significant age-related combined cataract, Left Eye (H25.812)  Procedure: Removal of cataract via phacoemulsification and insertion of intra-ocular lens Laural Benes and Johnson DIB00 +23.5D into the capsular bag of the Left Eye  Attending surgeon: Rudy Jew. Jamiah Recore, MD, MA  Anesthesia: MAC, Topical Akten  Complications: None  Estimated Blood Loss: <8mL (minimal)  Specimens: None  Implants: As above  Indications:  Visually significant age-related cataract, Left Eye  Procedure:  The patient was seen and identified in the pre-operative area. The operative eye was identified and dilated.  The operative eye was marked.  Topical anesthesia was administered to the operative eye.     The patient was then to the operative suite and placed in the supine position.  A timeout was performed confirming the patient, procedure to be performed, and all other relevant information.   The patient's face was prepped and draped in the usual fashion for intra-ocular surgery.  A lid speculum was placed into the operative eye and the surgical microscope moved into place and focused.  An inferotemporal paracentesis was created using a 20 gauge paracentesis blade.  Shugarcaine was injected into the anterior chamber.  Viscoelastic was injected into the anterior chamber.  A temporal clear-corneal main wound incision was created using a 2.63mm microkeratome.  A continuous curvilinear capsulorrhexis was initiated using an irrigating cystitome and completed using capsulorrhexis forceps.  Hydrodissection and hydrodeliniation were performed.  Viscoelastic was injected into the anterior chamber.  A phacoemulsification handpiece and a chopper as a second instrument were used to remove the nucleus and epinucleus. The irrigation/aspiration handpiece was used to remove any  remaining cortical material.   The capsular bag was reinflated with viscoelastic, checked, and found to be intact.  The intraocular lens was inserted into the capsular bag.  The irrigation/aspiration handpiece was used to remove any remaining viscoelastic.  The clear corneal wound and paracentesis wounds were then hydrated and checked with Weck-Cels to be watertight. 0.71mL of Moxfloxacin was injected into the anterior chamber. The lid-speculum was removed.  The drape was removed.  The patient's face was cleaned with a wet and dry 4x4.    A clear shield was taped over the eye. The patient was taken to the post-operative care unit in good condition, having tolerated the procedure well.  Post-Op Instructions: The patient will follow up at [facility] for a same day post-operative evaluation and will receive all other orders and instructions.

## 2022-09-20 NOTE — Transfer of Care (Signed)
Immediate Anesthesia Transfer of Care Note  Patient: [name]  Procedure(s) Performed: CATARACT EXTRACTION PHACO AND INTRAOCULAR LENS PLACEMENT (IOC) (Left: Eye)  Patient Location: Short Stay  Anesthesia Type:MAC  Level of Consciousness: awake and alert   Airway & Oxygen Therapy: Patient Spontanous Breathing  Post-op Assessment: Report given to RN and Post -op Vital signs reviewed and stable  Post vital signs: Reviewed and stable  Last Vitals:  Vitals Value Taken Time  BP 109/80 09/20/22 1128  Temp 36.7 C 09/20/22 1128  Pulse 67 09/20/22 1128  Resp 13 09/20/22 1128  SpO2 100 % 09/20/22 1128    Last Pain:  Vitals:   09/20/22 1128  TempSrc: Oral  PainSc: 0-No pain         Complications: No notable events documented.

## 2022-09-20 NOTE — Interval H&P Note (Signed)
History and Physical Interval Note:  09/20/2022 11:06 AM  [name]  has presented today for surgery, with the diagnosis of combined forms age related cataract, left eye.  The various methods of treatment have been discussed with the patient and family. After consideration of risks, benefits and other options for treatment, the patient has consented to  Procedure(s) with comments: CATARACT EXTRACTION PHACO AND INTRAOCULAR LENS PLACEMENT (IOC) (Left) - CDE: as a surgical intervention.  The patient's history has been reviewed, patient examined, no change in status, stable for surgery.  I have reviewed the patient's chart and labs.  Questions were answered to the patient's satisfaction.     [name]

## 2022-09-21 ENCOUNTER — Encounter (HOSPITAL_COMMUNITY): Payer: Self-pay | Admitting: Ophthalmology

## 2022-09-24 NOTE — Anesthesia Postprocedure Evaluation (Signed)
Anesthesia Post Note  Patient: [name]  Procedure(s) Performed: CATARACT EXTRACTION PHACO AND INTRAOCULAR LENS PLACEMENT (IOC) (Left: Eye)  Patient location during evaluation: Phase II Anesthesia Type: MAC Level of consciousness: awake Pain management: pain level controlled Vital Signs Assessment: post-procedure vital signs reviewed and stable Respiratory status: spontaneous breathing and respiratory function stable Cardiovascular status: blood pressure returned to baseline and stable Postop Assessment: no headache and no apparent nausea or vomiting Anesthetic complications: no Comments: Late entry   No notable events documented.   Last Vitals:  Vitals:   09/20/22 1031 09/20/22 1128  BP: 105/86 109/80  Pulse: 67 67  Resp: 15 13  Temp: 36.9 C 36.7 C  SpO2: 100% 100%    Last Pain:  Vitals:   09/20/22 1128  TempSrc: Oral  PainSc: 0-No pain                 Windell Norfolk

## 2022-11-08 ENCOUNTER — Ambulatory Visit (HOSPITAL_COMMUNITY): Payer: Managed Care, Other (non HMO) | Attending: Orthopedic Surgery

## 2023-02-17 NOTE — Progress Notes (Deleted)
 Name: [name] DOB: [birth year] MRN: [id number]  History of Present Illness: [name] is a 63 y.o. female who presents today for follow up visit at [facility]. ***She is accompanied by ***. - GU History: 1. Kidney stones. - 05/27/2022: Underwent diagnostic cystoscopy, bilateral ureteroscopy, bilateral retrograde pyelography, and bilateral ureteral stent placement by Dr. Ronne Binning for gross hematuria evaluation. Findings consistent with recent stone passage. - 07/28/2022: RUS showed nonobstructive small left kidney stones (<3 mm) and a possible 3 mm right kidney stone.  At last visit on 08/26/2022: Doing well.  Since last visit: ***  Today: KUB today: Awaiting radiology read; *** appreciated per provider interpretation.  She {Actions; denies-reports:120008} recent stone passage. She {Actions; denies-reports:120008} flank pain or abdominal pain. She {Actions; denies-reports:120008} fevers, nausea, or vomiting.  She {Actions; denies-reports:120008} increased urinary urgency, frequency, nocturia, dysuria, gross hematuria, hesitancy, straining to void, or sensations of incomplete emptying.   Fall Screening: Do you usually have a device to assist in your mobility? {yes/no:20286}  ***cane / ***walker / ***wheelchair  Medications: Current Outpatient Medications  Medication Sig Dispense Refill   Biotin 1000 MCG tablet Take 1,000 mcg by mouth every evening.     Cholecalciferol (VITAMIN D3) 50 MCG (2000 UT) CAPS Take 2,000 Units by mouth in the morning and at bedtime.     Cyanocobalamin (VITAMIN B-12 PO) Take 1 tablet by mouth every evening.     ferrous sulfate 325 (65 FE) MG tablet Take 325 mg by mouth every evening.     LYSINE PO Take 1 tablet by mouth every evening.     MAGNESIUM PO Take 1 tablet by mouth every evening.     metoprolol succinate (TOPROL-XL) 25 MG 24 hr tablet Take 25 mg by mouth in the morning.     Multiple Vitamin (MULTIVITAMIN WITH MINERALS)  TABS tablet Take 1 tablet by mouth every evening.     naproxen sodium (ALEVE) 220 MG tablet Take 220 mg by mouth in the morning.     OMEGA-3 FATTY ACIDS PO Take 1 capsule by mouth every evening.     rosuvastatin (CRESTOR) 20 MG tablet Take 20 mg by mouth in the morning.     tamsulosin (FLOMAX) 0.4 MG CAPS capsule TAKE 1 CAPSULE(0.4 MG) BY MOUTH DAILY 30 capsule 0   Turmeric 450 MG CAPS Take 1 capsule by mouth every evening.     No current facility-administered medications for this visit.    Allergies: No Known Allergies  Past Medical History:  Diagnosis Date   Anemia    Anxiety    Arthritis    Essential hypertension    GERD (gastroesophageal reflux disease)    Lumbago    Mixed hyperlipidemia    Past Surgical History:  Procedure Laterality Date   ABDOMINAL HYSTERECTOMY  1991-1992   [facility]   CARPAL TUNNEL RELEASE Right 10/26/2016   Procedure: RIGHT CARPAL TUNNEL RELEASE;  Surgeon: Cindee Salt, MD;  Location: [facility];  Service: Orthopedics;  Laterality: Right;   CATARACT EXTRACTION W/PHACO Right 07/19/2022   Procedure: CATARACT EXTRACTION PHACO AND INTRAOCULAR LENS PLACEMENT (IOC);  Surgeon: [name], MD;  Location: [facility];  Service: Ophthalmology;  Laterality: Right;  CDE: 5.86   CATARACT EXTRACTION W/PHACO Left 09/20/2022   Procedure: CATARACT EXTRACTION PHACO AND INTRAOCULAR LENS PLACEMENT (IOC);  Surgeon: [name], MD;  Location: [facility];  Service: Ophthalmology;  Laterality: Left;  CDE: 4.26   COLONOSCOPY N/A 07/13/2012   Procedure: COLONOSCOPY;  Surgeon: Gerrit Friends  Rourk, MD;  Location: [facility];  Service: Endoscopy;  Laterality: N/A;  2:30 PM-moved to [address] to notify pt   COLONOSCOPY WITH PROPOFOL N/A 08/19/2022   Procedure: COLONOSCOPY WITH PROPOFOL;  Surgeon: Corbin Ade, MD;  Location: [facility];  Service: Endoscopy;  Laterality: N/A;  8:45 AM, ASA 2   CYSTOSCOPY WITH RETROGRADE PYELOGRAM, URETEROSCOPY AND STENT PLACEMENT Bilateral  05/27/2022   Procedure: CYSTOSCOPY WITH RETROGRADE PYELOGRAM, DIAGNOSTIC URETEROSCOPY AND STENT PLACEMENT;  Surgeon: Malen Gauze, MD;  Location: [facility];  Service: Urology;  Laterality: Bilateral;   POLYPECTOMY  08/19/2022   Procedure: POLYPECTOMY;  Surgeon: Corbin Ade, MD;  Location[facility];  Service: Endoscopy;;   TRIGGER FINGER RELEASE Right 10/26/2016   Procedure: RELEASE TRIGGER FINGER/A-1 PULLEY RIGHT MIDDLE FINGER;  Surgeon: Cindee Salt, MD;  Location: [facility];  Service: Orthopedics;  Laterality: Right;   WISDOM TOOTH EXTRACTION     Family History  Problem Relation Age of Onset   Prostate cancer Father    Heart disease Father    Diabetes Maternal Grandmother    Diabetes Paternal Grandmother    Breast cancer Paternal Aunt    Colon cancer Neg Hx    Social History   Socioeconomic History   Marital status: Married    Spouse name: Not on file   Number of children: Not on file   Years of education: Not on file   Highest education level: Not on file  Occupational History   Not on file  Tobacco Use   Smoking status: Never   Smokeless tobacco: Never  Vaping Use   Vaping status: Never Used  Substance and Sexual Activity   Alcohol use: Yes    [place]    Drug use: No   Sexual activity: Yes    Birth control/protection: Surgical  Other Topics Concern   Not on file  Social History Narrative   Not on file   Social Drivers of Health   Financial Resource Strain: Not on file  Food Insecurity: Not on file  Transportation Needs: Not on file  Physical Activity: Not on file  Stress: Not on file  Social Connections: Not on file  Intimate Partner Violence: Not on file    SUBJECTIVE  Review of Systems Constitutional: Patient denies any unintentional weight loss or change in strength lntegumentary: Patient denies any rashes or pruritus Cardiovascular: Patient denies chest pain or syncope Respiratory: Patient denies  shortness of breath Gastrointestinal: Patient ***denies nausea, vomiting, constipation, or diarrhea Musculoskeletal: Patient denies muscle cramps or weakness Neurologic: Patient denies convulsions or seizures Allergic/Immunologic: Patient denies recent allergic reaction(s) Hematologic/Lymphatic: Patient denies bleeding tendencies Endocrine: Patient denies heat/cold intolerance  GU: As per HPI.  OBJECTIVE There were no vitals filed for this visit. There is no height or weight on file to calculate BMI.  Physical Examination Constitutional: No obvious distress; patient is non-toxic appearing  Cardiovascular: No visible lower extremity edema.  Respiratory: The patient does not have audible wheezing/stridor; respirations do not appear labored  Gastrointestinal: Abdomen non-distended Musculoskeletal: Normal ROM of UEs  Skin: No obvious rashes/open sores  Neurologic: CN 2-12 grossly intact Psychiatric: Answered questions appropriately with normal affect  Hematologic/Lymphatic/Immunologic: No obvious bruises or sites of spontaneous bleeding  UA: ***negative / *** WBC/hpf, *** RBC/hpf, *** bacteria ***Urine microscopy: ***negative / *** WBC/hpf, *** RBC/hpf, *** bacteria ***with no evidence of UTI ***with no evidence of microscopic hematuria ***otherwise unremarkable  PVR: *** ml  ASSESSMENT No diagnosis found.  ***We reviewed recent imaging results; ***awaiting radiology results, appears to have ***no acute findings per provider interpretation.  ***For stone prevention: Advised adequate hydration and we discussed option to consider low oxalate diet given that calcium oxalate is the most common type of stone. Handout provided about stone prevention diet.  ***For recurrent stone formers: We discussed option to proceed with 24 hour urinalysis ([facility]) for metabolic stone evaluation, which may help with targeted recommendations for dietary I medication therapies for stone prevention.  Patient elected to ***proceed/ ***hold off.  Will plan to follow up in ***6 months / ***1 year with ***KUB ***RUS for stone surveillance or sooner if needed.  Patient verbalized understanding of and agreement with current plan. All questions were answered.  PLAN Advised the following: Maintain adequate fluid intake daily. Drink citrus juice (lemon, lime or orange juice) routinely. Low oxalate diet. No follow-ups on file.  No orders of the defined types were placed in this encounter.   It has been explained that the patient is to follow regularly with their PCP in addition to all other providers involved in their care and to follow instructions provided by these respective offices. Patient advised to contact urology clinic if any urologic-pertaining questions, concerns, new symptoms or problems arise in the interim period.  There are no Patient Instructions on file for this visit.  Electronically signed by:  Donnita Falls, MSN, FNP-C, CUNP 02/17/2023 1:09 PM

## 2023-02-28 ENCOUNTER — Ambulatory Visit: Payer: Managed Care, Other (non HMO) | Admitting: Urology

## 2023-02-28 DIAGNOSIS — N200 Calculus of kidney: Secondary | ICD-10-CM

## 2023-04-22 DIAGNOSIS — E782 Mixed hyperlipidemia: Secondary | ICD-10-CM | POA: Diagnosis not present

## 2023-04-22 DIAGNOSIS — R7303 Prediabetes: Secondary | ICD-10-CM | POA: Diagnosis not present

## 2023-04-22 DIAGNOSIS — I10 Essential (primary) hypertension: Secondary | ICD-10-CM | POA: Diagnosis not present

## 2023-04-22 DIAGNOSIS — E559 Vitamin D deficiency, unspecified: Secondary | ICD-10-CM | POA: Diagnosis not present

## 2023-05-09 DIAGNOSIS — E785 Hyperlipidemia, unspecified: Secondary | ICD-10-CM | POA: Diagnosis not present

## 2023-05-09 DIAGNOSIS — I10 Essential (primary) hypertension: Secondary | ICD-10-CM | POA: Diagnosis not present

## 2023-05-09 DIAGNOSIS — R7303 Prediabetes: Secondary | ICD-10-CM | POA: Diagnosis not present

## 2023-05-09 DIAGNOSIS — E782 Mixed hyperlipidemia: Secondary | ICD-10-CM | POA: Diagnosis not present

## 2023-05-09 DIAGNOSIS — E875 Hyperkalemia: Secondary | ICD-10-CM | POA: Diagnosis not present

## 2023-11-08 DIAGNOSIS — D649 Anemia, unspecified: Secondary | ICD-10-CM | POA: Diagnosis not present

## 2023-11-08 DIAGNOSIS — I10 Essential (primary) hypertension: Secondary | ICD-10-CM | POA: Diagnosis not present

## 2023-11-14 DIAGNOSIS — Z0000 Encounter for general adult medical examination without abnormal findings: Secondary | ICD-10-CM | POA: Diagnosis not present

## 2023-11-14 DIAGNOSIS — E782 Mixed hyperlipidemia: Secondary | ICD-10-CM | POA: Diagnosis not present

## 2023-11-14 DIAGNOSIS — I10 Essential (primary) hypertension: Secondary | ICD-10-CM | POA: Diagnosis not present

## 2023-11-14 DIAGNOSIS — Z23 Encounter for immunization: Secondary | ICD-10-CM | POA: Diagnosis not present

## 2023-11-14 DIAGNOSIS — R7303 Prediabetes: Secondary | ICD-10-CM | POA: Diagnosis not present

## 2024-02-28 ENCOUNTER — Other Ambulatory Visit: Payer: Self-pay

## 2024-02-28 ENCOUNTER — Emergency Department (HOSPITAL_COMMUNITY): Admission: EM | Admit: 2024-02-28 | Discharge: 2024-02-28 | Disposition: A | Discharge status: Home / Self Care

## 2024-02-28 DIAGNOSIS — M436 Torticollis: Secondary | ICD-10-CM | POA: Insufficient documentation

## 2024-02-28 MED ORDER — METHOCARBAMOL 500 MG PO TABS: 1000.0000 mg | ORAL_TABLET | Freq: Four times a day (QID) | ORAL | 0 refills | Status: AC | PRN

## 2024-02-28 MED ORDER — HYDROMORPHONE HCL 1 MG/ML IJ SOLN
0.5000 mg | Freq: Once | INTRAMUSCULAR | Status: AC
  Administered 2024-02-28: 0.5 mg via INTRAMUSCULAR
  Filled 2024-02-28: qty 1

## 2024-02-28 MED ORDER — LIDOCAINE 5 % EX PTCH
1.0000 | MEDICATED_PATCH | CUTANEOUS | Status: DC
  Administered 2024-02-28: 1 via TRANSDERMAL
  Filled 2024-02-28: qty 1

## 2024-02-28 MED ORDER — KETOROLAC TROMETHAMINE 15 MG/ML IJ SOLN
15.0000 mg | Freq: Once | INTRAMUSCULAR | Status: AC
  Administered 2024-02-28: 15 mg via INTRAMUSCULAR
  Filled 2024-02-28: qty 1

## 2024-02-28 MED ORDER — LIDOCAINE 5 % EX PTCH: 1.0000 | MEDICATED_PATCH | CUTANEOUS | 0 refills | Status: AC

## 2024-02-28 NOTE — ED Provider Notes (Signed)
 "  EMERGENCY DEPARTMENT AT [facility] Provider Note   CSN: [id number] Arrival date & time: 02/28/24  0901     Patient presents with: Neck Pain   [name] is a 64 y.o. female.   64 year old female presents for evaluation of neck pain.  Started Saturday's been getting worse.  States that she slept funny on Friday night and thought she had a crick in her neck.  States the pain has gotten worse and is rating to her head.  She states ibuprofen  has helped but when it wears off the pain returns.  She denies any falls or injury.  She is not have any vision changes, numbness or tingling, or any other symptoms or concerns at this time.   Neck Pain Associated symptoms: no chest pain and no fever        Prior to Admission medications  Medication Sig Start Date End Date Taking? Authorizing Provider  lidocaine  (LIDODERM ) 5 % Place 1 patch onto the skin daily. Remove & Discard patch within 12 hours or as directed by MD 02/28/24  Yes Caspar Favila L, DO  methocarbamol  (ROBAXIN ) 500 MG tablet Take 2 tablets (1,000 mg total) by mouth every 6 (six) hours as needed for up to 7 days for muscle spasms. 02/28/24 03/06/24 Yes Gemini Beaumier L, DO  Biotin 1000 MCG tablet Take 1,000 mcg by mouth every evening.    [provider]  Cholecalciferol (VITAMIN D3) 50 MCG (2000 UT) CAPS Take 2,000 Units by mouth in the morning and at bedtime.    [provider]  Cyanocobalamin (VITAMIN B-12 PO) Take 1 tablet by mouth every evening.    [provider]  ferrous sulfate 325 (65 FE) MG tablet Take 325 mg by mouth every evening.    [provider]  LYSINE PO Take 1 tablet by mouth every evening.    [provider]  MAGNESIUM PO Take 1 tablet by mouth every evening.    [provider]  metoprolol succinate (TOPROL-XL) 25 MG 24 hr tablet Take 25 mg by mouth in the morning. 08/16/16   [provider]  Multiple Vitamin (MULTIVITAMIN  WITH MINERALS) TABS tablet Take 1 tablet by mouth every evening.    [provider]  naproxen sodium (ALEVE) 220 MG tablet Take 220 mg by mouth in the morning.    [provider]  OMEGA-3 FATTY ACIDS PO Take 1 capsule by mouth every evening.    [provider]  rosuvastatin (CRESTOR) 20 MG tablet Take 20 mg by mouth in the morning.    [provider]  tamsulosin  (FLOMAX ) 0.4 MG CAPS capsule TAKE 1 CAPSULE(0.4 MG) BY MOUTH DAILY 07/03/22   McKenzie, Belvie CROME, MD  Turmeric 450 MG CAPS Take 1 capsule by mouth every evening.    [provider]    Allergies: Patient has no known allergies.    Review of Systems  Constitutional:  Negative for chills and fever.  HENT:  Negative for ear pain and sore throat.   Eyes:  Negative for pain and visual disturbance.  Respiratory:  Negative for cough and shortness of breath.   Cardiovascular:  Negative for chest pain and palpitations.  Gastrointestinal:  Negative for abdominal pain and vomiting.  Genitourinary:  Negative for dysuria and hematuria.  Musculoskeletal:  Positive for neck pain. Negative for arthralgias and back pain.  Skin:  Negative for color change and rash.  Neurological:  Negative for seizures and syncope.  All other systems  reviewed and are negative.   Updated Vital Signs BP (!) 137/107 (BP Location: Left Arm)   Pulse 90   Temp 97.9 F (36.6 C)   Resp 18   SpO2 100%   Physical Exam Vitals and nursing note reviewed.  Constitutional:      General: She is not in acute distress.    Appearance: She is well-developed.  HENT:     Head: Normocephalic and atraumatic.  Eyes:     Conjunctiva/sclera: Conjunctivae normal.  Cardiovascular:     Rate and Rhythm: Normal rate and regular rhythm.     Heart sounds: No murmur heard. Pulmonary:     Effort: Pulmonary effort is normal. No respiratory distress.     Breath sounds: Normal breath sounds.  Abdominal:     Palpations: Abdomen is soft.      Tenderness: There is no abdominal tenderness.  Musculoskeletal:        General: No swelling.     Cervical back: Neck supple.     Comments: There is hypertonicity to bilateral neck musculature, worse on the left with tenderness to palpation  Skin:    General: Skin is warm and dry.     Capillary Refill: Capillary refill takes less than 2 seconds.  Neurological:     General: No focal deficit present.     Mental Status: She is alert. Mental status is at baseline.  Psychiatric:        Mood and Affect: Mood normal.     (all labs ordered are listed, but only abnormal results are displayed) Labs Reviewed - No data to display  EKG: None  Radiology: No results found.   Procedures   Medications Ordered in the ED  ketorolac  (TORADOL ) 15 MG/ML injection 15 mg (has no administration in time range)  HYDROmorphone  (DILAUDID ) injection 0.5 mg (has no administration in time range)  lidocaine  (LIDODERM ) 5 % 1 patch (has no administration in time range)                                    Medical Decision Making Patient here for muscle spasm of her neck.  I gave her Toradol  and Dilaudid  here and lidocaine  patch we will give her prescription for Robaxin  and lidocaine  patches to use as needed and she is advised Tylenol  Motrin  as needed for pain.  She has no other neurologic deficits and no other concerning signs or symptoms with stable vital signs.  She feels comfortable to plan be discharged home.  Vies follow-up with primary care doctor as needed or return to the ER for any new or worsening symptoms.  Problems Addressed: Torticollis, acute: acute illness or injury  Amount and/or Complexity of Data Reviewed External Data Reviewed: notes.    Details: No prior Records for review  Risk OTC drugs. Prescription drug management. Parenteral controlled substances.     Final diagnoses:  Torticollis, acute    ED Discharge Orders          Ordered    methocarbamol  (ROBAXIN ) 500 MG tablet   Every 6 hours PRN        02/28/24 0923    lidocaine  (LIDODERM ) 5 %  Every 24 hours        02/28/24 0923               Gennaro Duwaine CROME, DO 02/28/24 [phone]  "

## 2024-02-28 NOTE — Discharge Instructions (Signed)
 Alternate Tylenol  and Motrin  every 3 hours as needed for pain.  Take your Robaxin  up to 4 times a day as needed and use your lidocaine  patches daily as needed.  Continue to use heating packs, stretch, and massage the area.
